# Patient Record
Sex: Male | Born: 1960 | Race: Black or African American | Hispanic: No | Marital: Single | Smoking: Never smoker
Health system: Southern US, Community
[De-identification: ages and names within clinical notes are randomized; demographics above are authoritative.]

## PROBLEM LIST (undated history)

## (undated) DIAGNOSIS — M1990 Unspecified osteoarthritis, unspecified site: Secondary | ICD-10-CM

## (undated) DIAGNOSIS — K852 Alcohol induced acute pancreatitis without necrosis or infection: Secondary | ICD-10-CM

## (undated) DIAGNOSIS — R1013 Epigastric pain: Secondary | ICD-10-CM

## (undated) DIAGNOSIS — K859 Acute pancreatitis without necrosis or infection, unspecified: Secondary | ICD-10-CM

## (undated) DIAGNOSIS — F1910 Other psychoactive substance abuse, uncomplicated: Secondary | ICD-10-CM

## (undated) DIAGNOSIS — T7840XA Allergy, unspecified, initial encounter: Secondary | ICD-10-CM

## (undated) DIAGNOSIS — I639 Cerebral infarction, unspecified: Secondary | ICD-10-CM

## (undated) DIAGNOSIS — J45909 Unspecified asthma, uncomplicated: Secondary | ICD-10-CM

## (undated) DIAGNOSIS — I10 Essential (primary) hypertension: Secondary | ICD-10-CM

## (undated) DIAGNOSIS — N179 Acute kidney failure, unspecified: Secondary | ICD-10-CM

## (undated) HISTORY — DX: Cerebral infarction, unspecified: I63.9

## (undated) HISTORY — DX: Acute kidney failure, unspecified: N17.9

## (undated) HISTORY — DX: Unspecified osteoarthritis, unspecified site: M19.90

## (undated) HISTORY — DX: Allergy, unspecified, initial encounter: T78.40XA

## (undated) HISTORY — PX: TONSILLECTOMY: SUR1361

## (undated) HISTORY — DX: Alcohol induced acute pancreatitis without necrosis or infection: K85.20

## (undated) HISTORY — DX: Other psychoactive substance abuse, uncomplicated: F19.10

## (undated) HISTORY — DX: Essential (primary) hypertension: I10

## (undated) HISTORY — DX: Epigastric pain: R10.13

---

## 2012-08-19 ENCOUNTER — Emergency Department: Payer: Self-pay | Admitting: Emergency Medicine

## 2012-08-19 LAB — COMPREHENSIVE METABOLIC PANEL
Alkaline Phosphatase: 72 U/L (ref 50–136)
Anion Gap: 10 (ref 7–16)
BUN: 11 mg/dL (ref 7–18)
Bilirubin,Total: 0.8 mg/dL (ref 0.2–1.0)
Calcium, Total: 8.4 mg/dL — ABNORMAL LOW (ref 8.5–10.1)
Co2: 23 mmol/L (ref 21–32)
Creatinine: 1.08 mg/dL (ref 0.60–1.30)
EGFR (Non-African Amer.): >60
Glucose: 92 mg/dL (ref 65–99)
SGOT(AST): 34 U/L (ref 15–37)
SGPT (ALT): 34 U/L (ref 12–78)
Total Protein: 7.5 g/dL (ref 6.4–8.2)

## 2012-08-19 LAB — CBC
Platelet: 185 10*3/uL (ref 150–440)
RDW: 12.8 % (ref 11.5–14.5)
WBC: 5.2 10*3/uL (ref 3.8–10.6)

## 2013-08-16 ENCOUNTER — Emergency Department: Payer: Self-pay | Admitting: Emergency Medicine

## 2013-08-16 LAB — URINALYSIS, COMPLETE
Bilirubin,UR: NEGATIVE
Glucose,UR: NEGATIVE mg/dL
Ketone: NEGATIVE
Leukocyte Esterase: NEGATIVE
Nitrite: NEGATIVE
Ph: 5
Protein: NEGATIVE
RBC,UR: <1 /HPF
Specific Gravity: 1.005
Squamous Epithelial: <1
WBC UR: <1 /HPF

## 2013-08-16 LAB — CBC
HCT: 43 % (ref 40.0–52.0)
HGB: 14.1 g/dL (ref 13.0–18.0)
MCH: 31 pg (ref 26.0–34.0)
MCHC: 32.7 g/dL (ref 32.0–36.0)
MCV: 95 fL (ref 80–100)
Platelet: 199 10*3/uL (ref 150–440)
RBC: 4.53 10*6/uL (ref 4.40–5.90)
RDW: 13.9 % (ref 11.5–14.5)
WBC: 5.9 10*3/uL (ref 3.8–10.6)

## 2013-08-16 LAB — COMPREHENSIVE METABOLIC PANEL
ALBUMIN: 3.8 g/dL (ref 3.4–5.0)
AST: 39 U/L — AB (ref 15–37)
Alkaline Phosphatase: 61 U/L
Anion Gap: 13 (ref 7–16)
BILIRUBIN TOTAL: 0.6 mg/dL (ref 0.2–1.0)
BUN: 17 mg/dL (ref 7–18)
CALCIUM: 8.7 mg/dL (ref 8.5–10.1)
Chloride: 104 mmol/L (ref 98–107)
Co2: 19 mmol/L — ABNORMAL LOW (ref 21–32)
Creatinine: 1.18 mg/dL (ref 0.60–1.30)
GLUCOSE: 83 mg/dL (ref 65–99)
OSMOLALITY: 273 (ref 275–301)
POTASSIUM: 3.2 mmol/L — AB (ref 3.5–5.1)
SGPT (ALT): 44 U/L (ref 12–78)
SODIUM: 136 mmol/L (ref 136–145)
Total Protein: 8 g/dL (ref 6.4–8.2)

## 2014-09-23 ENCOUNTER — Encounter: Payer: Self-pay | Admitting: Emergency Medicine

## 2014-09-23 ENCOUNTER — Inpatient Hospital Stay
Admission: EM | Admit: 2014-09-23 | Discharge: 2014-09-24 | DRG: 439 | Disposition: A | Discharge status: Home / Self Care | Payer: Self-pay | Attending: Internal Medicine | Admitting: Internal Medicine

## 2014-09-23 ENCOUNTER — Emergency Department: Payer: Self-pay

## 2014-09-23 DIAGNOSIS — Z8249 Family history of ischemic heart disease and other diseases of the circulatory system: Secondary | ICD-10-CM

## 2014-09-23 DIAGNOSIS — Z808 Family history of malignant neoplasm of other organs or systems: Secondary | ICD-10-CM

## 2014-09-23 DIAGNOSIS — E785 Hyperlipidemia, unspecified: Secondary | ICD-10-CM | POA: Diagnosis present

## 2014-09-23 DIAGNOSIS — K852 Alcohol induced acute pancreatitis without necrosis or infection: Secondary | ICD-10-CM | POA: Diagnosis present

## 2014-09-23 DIAGNOSIS — Z9889 Other specified postprocedural states: Secondary | ICD-10-CM

## 2014-09-23 DIAGNOSIS — R1013 Epigastric pain: Secondary | ICD-10-CM

## 2014-09-23 DIAGNOSIS — R509 Fever, unspecified: Secondary | ICD-10-CM

## 2014-09-23 DIAGNOSIS — I10 Essential (primary) hypertension: Secondary | ICD-10-CM | POA: Diagnosis present

## 2014-09-23 DIAGNOSIS — N179 Acute kidney failure, unspecified: Secondary | ICD-10-CM | POA: Diagnosis present

## 2014-09-23 DIAGNOSIS — F1410 Cocaine abuse, uncomplicated: Secondary | ICD-10-CM | POA: Diagnosis present

## 2014-09-23 HISTORY — DX: Acute pancreatitis without necrosis or infection, unspecified: K85.90

## 2014-09-23 HISTORY — DX: Alcohol induced acute pancreatitis without necrosis or infection: K85.20

## 2014-09-23 HISTORY — DX: Essential (primary) hypertension: I10

## 2014-09-23 LAB — URINE DRUG SCREEN, QUALITATIVE (ARMC ONLY)
Amphetamines, Ur Screen: NOT DETECTED
BENZODIAZEPINE, UR SCRN: NOT DETECTED
Barbiturates, Ur Screen: NOT DETECTED
CANNABINOID 50 NG, UR ~~LOC~~: NOT DETECTED
COCAINE METABOLITE, UR ~~LOC~~: POSITIVE — AB
MDMA (Ecstasy)Ur Screen: NOT DETECTED
METHADONE SCREEN, URINE: NOT DETECTED
Opiate, Ur Screen: POSITIVE — AB
Phencyclidine (PCP) Ur S: NOT DETECTED
Tricyclic, Ur Screen: NOT DETECTED

## 2014-09-23 LAB — HEPATIC FUNCTION PANEL
ALT: 27 U/L (ref 17–63)
AST: 49 U/L — ABNORMAL HIGH (ref 15–41)
Albumin: 4 g/dL (ref 3.5–5.0)
Alkaline Phosphatase: 68 U/L (ref 38–126)
Bilirubin, Direct: 0.5 mg/dL (ref 0.1–0.5)
Indirect Bilirubin: 1.8 mg/dL — ABNORMAL HIGH (ref 0.3–0.9)
TOTAL PROTEIN: 8.3 g/dL — AB (ref 6.5–8.1)
Total Bilirubin: 2.3 mg/dL — ABNORMAL HIGH (ref 0.3–1.2)

## 2014-09-23 LAB — BASIC METABOLIC PANEL
ANION GAP: 13 (ref 5–15)
BUN: 25 mg/dL — ABNORMAL HIGH (ref 6–20)
CO2: 24 mmol/L (ref 22–32)
Calcium: 8.8 mg/dL — ABNORMAL LOW (ref 8.9–10.3)
Chloride: 97 mmol/L — ABNORMAL LOW (ref 101–111)
Creatinine, Ser: 1.56 mg/dL — ABNORMAL HIGH (ref 0.61–1.24)
GFR, EST AFRICAN AMERICAN: 57 mL/min — AB (ref >60)
GFR, EST NON AFRICAN AMERICAN: 49 mL/min — AB (ref >60)
Glucose, Bld: 87 mg/dL (ref 65–99)
POTASSIUM: 4.4 mmol/L (ref 3.5–5.1)
SODIUM: 134 mmol/L — AB (ref 135–145)

## 2014-09-23 LAB — CBC WITH DIFFERENTIAL/PLATELET
BASOS PCT: 1 %
Basophils Absolute: 0.1 10*3/uL (ref 0–0.1)
Eosinophils Absolute: 0 10*3/uL (ref 0–0.7)
Eosinophils Relative: 0 %
HCT: 41.3 % (ref 40.0–52.0)
Hemoglobin: 13.9 g/dL (ref 13.0–18.0)
LYMPHS ABS: 0.2 10*3/uL — AB (ref 1.0–3.6)
Lymphocytes Relative: 2 %
MCH: 31.5 pg (ref 26.0–34.0)
MCHC: 33.6 g/dL (ref 32.0–36.0)
MCV: 93.7 fL (ref 80.0–100.0)
MONO ABS: 0.6 10*3/uL (ref 0.2–1.0)
Monocytes Relative: 4 %
NEUTROS ABS: 14.5 10*3/uL — AB (ref 1.4–6.5)
Neutrophils Relative %: 93 %
Platelets: 174 10*3/uL (ref 150–440)
RBC: 4.4 MIL/uL (ref 4.40–5.90)
RDW: 13.3 % (ref 11.5–14.5)
WBC: 15.4 10*3/uL — AB (ref 3.8–10.6)

## 2014-09-23 LAB — LIPID PANEL
CHOLESTEROL: 189 mg/dL (ref 0–200)
HDL: 73 mg/dL (ref >40)
LDL CALC: 105 mg/dL — AB (ref 0–99)
TRIGLYCERIDES: 54 mg/dL (ref <150)
Total CHOL/HDL Ratio: 2.6 RATIO
VLDL: 11 mg/dL (ref 0–40)

## 2014-09-23 LAB — TROPONIN I: Troponin I: <0.03 ng/mL (ref <0.031)

## 2014-09-23 LAB — LIPASE, BLOOD: Lipase: 1349 U/L — ABNORMAL HIGH (ref 22–51)

## 2014-09-23 LAB — ETHANOL: Alcohol, Ethyl (B): <5 mg/dL (ref <5)

## 2014-09-23 MED ORDER — ACETAMINOPHEN 325 MG PO TABS: 650.0000 mg | ORAL_TABLET | Freq: Four times a day (QID) | ORAL | Status: DC | PRN

## 2014-09-23 MED ORDER — ACETAMINOPHEN 500 MG PO TABS: 1000.0000 mg | ORAL_TABLET | Freq: Once | ORAL | Status: DC

## 2014-09-23 MED ORDER — ACETAMINOPHEN 650 MG RE SUPP: 650.0000 mg | Freq: Four times a day (QID) | RECTAL | Status: DC | PRN

## 2014-09-23 MED ORDER — FOLIC ACID 1 MG PO TABS
1.0000 mg | ORAL_TABLET | Freq: Every day | ORAL | Status: DC
  Administered 2014-09-23 – 2014-09-24 (×2): 1 mg via ORAL
  Filled 2014-09-23: qty 1

## 2014-09-23 MED ORDER — LORAZEPAM 1 MG PO TABS: 1.0000 mg | ORAL_TABLET | Freq: Four times a day (QID) | ORAL | Status: DC | PRN

## 2014-09-23 MED ORDER — SODIUM CHLORIDE 0.9 % IV SOLN
INTRAVENOUS | Status: DC
  Administered 2014-09-23: 11:00:00 via INTRAVENOUS

## 2014-09-23 MED ORDER — HYDROCODONE-ACETAMINOPHEN 5-325 MG PO TABS
1.0000 | ORAL_TABLET | ORAL | Status: DC | PRN
  Administered 2014-09-23 – 2014-09-24 (×3): 2 via ORAL
  Administered 2014-09-24: 1 via ORAL
  Filled 2014-09-23: qty 1
  Filled 2014-09-23 (×2): qty 2

## 2014-09-23 MED ORDER — MORPHINE SULFATE 2 MG/ML IJ SOLN
INTRAMUSCULAR | Status: AC
  Administered 2014-09-23: 1 mg via INTRAVENOUS
  Filled 2014-09-23: qty 1

## 2014-09-23 MED ORDER — VITAMIN B-1 100 MG PO TABS
ORAL_TABLET | ORAL | Status: AC
  Administered 2014-09-23: 100 mg via ORAL
  Filled 2014-09-23: qty 1

## 2014-09-23 MED ORDER — ADULT MULTIVITAMIN W/MINERALS CH
1.0000 | ORAL_TABLET | Freq: Every day | ORAL | Status: DC
  Administered 2014-09-23 – 2014-09-24 (×2): 1 via ORAL
  Filled 2014-09-23 (×2): qty 1

## 2014-09-23 MED ORDER — ONDANSETRON HCL 4 MG/2ML IJ SOLN
4.0000 mg | Freq: Four times a day (QID) | INTRAMUSCULAR | Status: DC | PRN
  Administered 2014-09-23: 4 mg via INTRAVENOUS
  Filled 2014-09-23: qty 2

## 2014-09-23 MED ORDER — LORAZEPAM 2 MG/ML IJ SOLN: 1.0000 mg | Freq: Four times a day (QID) | INTRAMUSCULAR | Status: DC | PRN

## 2014-09-23 MED ORDER — ALUM & MAG HYDROXIDE-SIMETH 200-200-20 MG/5ML PO SUSP: 30.0000 mL | Freq: Four times a day (QID) | ORAL | Status: DC | PRN

## 2014-09-23 MED ORDER — LORAZEPAM 2 MG/ML IJ SOLN: 0.0000 mg | Freq: Four times a day (QID) | INTRAMUSCULAR | Status: DC

## 2014-09-23 MED ORDER — VITAMIN B-1 100 MG PO TABS
100.0000 mg | ORAL_TABLET | Freq: Every day | ORAL | Status: DC
  Administered 2014-09-23: 100 mg via ORAL
  Filled 2014-09-23: qty 1

## 2014-09-23 MED ORDER — SODIUM CHLORIDE 0.9 % IJ SOLN: 3.0000 mL | Freq: Two times a day (BID) | INTRAMUSCULAR | Status: DC

## 2014-09-23 MED ORDER — MORPHINE SULFATE 2 MG/ML IJ SOLN
1.0000 mg | INTRAMUSCULAR | Status: DC | PRN
  Administered 2014-09-23 (×2): 1 mg via INTRAVENOUS
  Filled 2014-09-23: qty 1

## 2014-09-23 MED ORDER — HYDRALAZINE HCL 20 MG/ML IJ SOLN
10.0000 mg | Freq: Four times a day (QID) | INTRAMUSCULAR | Status: DC | PRN
  Administered 2014-09-23: 10 mg via INTRAVENOUS
  Filled 2014-09-23: qty 1

## 2014-09-23 MED ORDER — MORPHINE SULFATE 4 MG/ML IJ SOLN
4.0000 mg | Freq: Once | INTRAMUSCULAR | Status: AC
  Administered 2014-09-23: 4 mg via INTRAVENOUS

## 2014-09-23 MED ORDER — FOLIC ACID 1 MG PO TABS
ORAL_TABLET | ORAL | Status: AC
  Administered 2014-09-23: 1 mg via ORAL
  Filled 2014-09-23: qty 1

## 2014-09-23 MED ORDER — THIAMINE HCL 100 MG/ML IJ SOLN
100.0000 mg | Freq: Every day | INTRAMUSCULAR | Status: DC
  Administered 2014-09-24: 100 mg via INTRAVENOUS
  Filled 2014-09-23: qty 2

## 2014-09-23 MED ORDER — ENOXAPARIN SODIUM 40 MG/0.4ML ~~LOC~~ SOLN
40.0000 mg | SUBCUTANEOUS | Status: DC
  Administered 2014-09-23: 40 mg via SUBCUTANEOUS
  Filled 2014-09-23: qty 0.4

## 2014-09-23 MED ORDER — CLONIDINE HCL 0.1 MG PO TABS
ORAL_TABLET | ORAL | Status: AC
  Administered 2014-09-23: 0.1 mg via ORAL
  Filled 2014-09-23: qty 1

## 2014-09-23 MED ORDER — MORPHINE SULFATE 4 MG/ML IJ SOLN
INTRAMUSCULAR | Status: AC
  Administered 2014-09-23: 4 mg via INTRAVENOUS
  Filled 2014-09-23: qty 1

## 2014-09-23 MED ORDER — CLONIDINE HCL 0.1 MG PO TABS
0.1000 mg | ORAL_TABLET | Freq: Two times a day (BID) | ORAL | Status: DC
  Administered 2014-09-23 – 2014-09-24 (×3): 0.1 mg via ORAL
  Filled 2014-09-23 (×2): qty 1

## 2014-09-23 MED ORDER — ONDANSETRON HCL 4 MG/2ML IJ SOLN
4.0000 mg | Freq: Once | INTRAMUSCULAR | Status: AC
  Administered 2014-09-23: 4 mg via INTRAVENOUS

## 2014-09-23 MED ORDER — THIAMINE HCL 100 MG/ML IJ SOLN
Freq: Once | INTRAVENOUS | Status: AC
  Administered 2014-09-23: 09:00:00 via INTRAVENOUS
  Filled 2014-09-23: qty 1000

## 2014-09-23 MED ORDER — HYDROCODONE-ACETAMINOPHEN 5-325 MG PO TABS
ORAL_TABLET | ORAL | Status: AC
  Administered 2014-09-23: 2 via ORAL
  Filled 2014-09-23: qty 2

## 2014-09-23 MED ORDER — HYDRALAZINE HCL 20 MG/ML IJ SOLN: 10.0000 mg | Freq: Four times a day (QID) | INTRAMUSCULAR | Status: DC | PRN

## 2014-09-23 MED ORDER — ONDANSETRON HCL 4 MG/2ML IJ SOLN
INTRAMUSCULAR | Status: AC
  Administered 2014-09-23: 4 mg via INTRAVENOUS
  Filled 2014-09-23: qty 2

## 2014-09-23 MED ORDER — ONDANSETRON HCL 4 MG PO TABS: 4.0000 mg | ORAL_TABLET | Freq: Four times a day (QID) | ORAL | Status: DC | PRN

## 2014-09-23 NOTE — ED Provider Notes (Signed)
[facility] Emergency Department Provider Note  ____________________________________________  Time seen: Approximately 4:38 AM  I have reviewed the triage vital signs and the nursing notes.   HISTORY  Chief Complaint Abdominal Pain    HPI Paul Howard is a 54 y.o. male who presents to the ED via EMS from home for complaints of epigastric pain. Complains of 10/10 nonradiating epigastric pain onset yesterday afternoon. Patient has a history of alcohol-induced pancreatitis. Patient states he had 3 episodes of nausea/vomiting. Admits to daily drinking of beers. Denies history of DTs. Denies chest pain, shortness of breath, diarrhea, numbness, tingling. Patient was not aware he is running a fever.   Past Medical History  Diagnosis Date  . Hypertension   . Pancreatitis     There are no active problems to display for this patient.   Past Surgical History  Procedure Laterality Date  . Tonsillectomy      No current outpatient prescriptions on file.  Allergies Review of patient's allergies indicates no known allergies.  No family history on file.  Social History History  Substance Use Topics  . Smoking status: Never Smoker   . Smokeless tobacco: Not on file  . Alcohol Use: 1.8 oz/week    3 Cans of beer per week    Review of Systems Constitutional: No fever/chills Eyes: No visual changes. ENT: No sore throat. Cardiovascular: Denies chest pain. Respiratory: Denies shortness of breath. Gastrointestinal: Positive for abdominal pain.  Positive for nausea and vomiting.  No diarrhea.  No constipation. Genitourinary: Negative for dysuria. Musculoskeletal: Negative for back pain. Skin: Negative for rash. Neurological: Negative for headaches, focal weakness or numbness.  10-point ROS otherwise negative.  ____________________________________________   PHYSICAL EXAM:  VITAL SIGNS: ED Triage Vitals  Enc Vitals Group     BP 09/23/14 0345  188/122 mmHg     Pulse Rate 09/23/14 0345 113     Resp 09/23/14 0345 28     Temp 09/23/14 0345 100.8 F (38.2 C)     Temp Source 09/23/14 0345 Oral     SpO2 --      Weight 09/23/14 0345 165 lb (74.844 kg)     Height 09/23/14 0345 5\' 6"  (1.676 m)     Head Cir --      Peak Flow --      Pain Score 09/23/14 0346 10     Pain Loc --      Pain Edu? --      Excl. in [place]? --     Constitutional: Alert and oriented. Well appearing and in moderate acute distress. Eyes: Conjunctivae are bloodshot bilaterally. PERRL. EOMI. Head: Atraumatic. Nose: No congestion/rhinnorhea. Mouth/Throat: Mucous membranes are moist.  Oropharynx non-erythematous. Neck: No stridor.   Cardiovascular: Tachycardic, regular rhythm. Grossly normal heart sounds.  Good peripheral circulation. Respiratory: Normal respiratory effort.  No retractions. Lungs CTAB. Gastrointestinal: Soft, tender to palpation epigastrium with voluntary guarding. No distention. No abdominal bruits. No CVA tenderness. Musculoskeletal: No lower extremity tenderness nor edema.  No joint effusions. Neurologic:  Normal speech and language. No gross focal neurologic deficits are appreciated. Speech is normal.  Skin:  Skin is warm, dry and intact. No rash noted. Psychiatric: Mood and affect are normal. Speech and behavior are normal.  ____________________________________________   LABS (all labs ordered are listed, but only abnormal results are displayed)  Labs Reviewed  CBC WITH DIFFERENTIAL/PLATELET - Abnormal; Notable for the following:    WBC 15.4 (*)    Neutro Abs 14.5 (*)  Lymphs Abs 0.2 (*)    All other components within normal limits  BASIC METABOLIC PANEL - Abnormal; Notable for the following:    Sodium 134 (*)    Chloride 97 (*)    BUN 25 (*)    Creatinine, Ser 1.56 (*)    Calcium 8.8 (*)    GFR calc non Af Amer 49 (*)    GFR calc Af Amer 57 (*)    All other components within normal limits  LIPASE, BLOOD - Abnormal; Notable for  the following:    Lipase 1349 (*)    All other components within normal limits  HEPATIC FUNCTION PANEL - Abnormal; Notable for the following:    Total Protein 8.3 (*)    AST 49 (*)    Total Bilirubin 2.3 (*)    Indirect Bilirubin 1.8 (*)    All other components within normal limits  ETHANOL  TROPONIN I   ____________________________________________  EKG  ED ECG REPORT I, SUNG,JADE J, the attending physician, personally viewed and interpreted this ECG.   Date: 09/23/2014  EKG Time: 0442  Rate: 111  Rhythm: sinus tachycardia  Axis: Normal  Intervals:none  ST&T Change: Inverted T waves inferior laterally  ____________________________________________  RADIOLOGY  Ultrasound abdomen limited RUQ interpreted per Dr. Pascal Lux: Mild gallbladder wall thickening and edema without cholelithiasis. Favor reactive changes (potentially from patient's pancreatitis) over cholecystitis from occult stone.  ____________________________________________   PROCEDURES  Procedure(s) performed: None  Critical Care performed: Yes, see critical care note(s)   CRITICAL CARE Performed by: Paulette Blanch   Total critical care time: 30 minutes  Critical care time was exclusive of separately billable procedures and treating other patients.  Critical care was necessary to treat or prevent imminent or life-threatening deterioration.  Critical care was time spent personally by me on the following activities: development of treatment plan with patient and/or surrogate as well as nursing, discussions with consultants, evaluation of patient's response to treatment, examination of patient, obtaining history from patient or surrogate, ordering and performing treatments and interventions, ordering and review of laboratory studies, ordering and review of radiographic studies, pulse oximetry and re-evaluation of patient's condition.   ____________________________________________   INITIAL IMPRESSION /  ASSESSMENT AND PLAN / ED COURSE  Pertinent labs & imaging results that were available during my care of the patient were reviewed by me and considered in my medical decision making (see chart for details).  54 year old male who presents with epigastric pain, fever and vomiting. Prior history of alcohol-induced pancreatitis. Will check lab work including liver function and lipase, infuse IV fluids, IV analgesia, obtain ultrasound to evaluate biliary tree.  ----------------------------------------- 6:15 AM on 09/23/2014 -----------------------------------------  Laboratory results notable for renal insufficiency, elevated bilirubin and lipase. I have discussed case with Dr. Marcille Blanco who will evaluate patient in the ED for hospital admission.  ----------------------------------------- 7:37 AM on 09/23/2014 -----------------------------------------  Updated Dr. Posey Pronto of ultrasound findings. Patient is resting comfortably in no acute distress. ____________________________________________   FINAL CLINICAL IMPRESSION(S) / ED DIAGNOSES  Final diagnoses:  Epigastric pain  Fever, unspecified fever cause  Alcohol-induced acute pancreatitis      Paulette Blanch, MD 09/23/14 [phone]

## 2014-09-23 NOTE — Progress Notes (Signed)
°   09/23/14 1100  Clinical Encounter Type  Visited With Patient (Prayer provided as requested.)  Visit Type Initial  Referral From Nurse  Spiritual Encounters  Spiritual Needs Prayer (Pastoral Care provided a well as prayer.)

## 2014-09-23 NOTE — H&P (Addendum)
[name] at [place] NAME: Paul Howard    MR#:  [id number]  DATE OF BIRTH:  April 27, 1960  DATE OF ADMISSION:  09/23/2014  PRIMARY CARE PHYSICIAN: No primary care provider on file.   REQUESTING/REFERRING PHYSICIAN: DR Luvenia Starch SUNG  CHIEF COMPLAINT:  Abdominal pain since yesterday 2:00 pm  HISTORY OF PRESENT ILLNESS:  Paul Howard  is a 54 y.o. male with a known history of hypertension not on any medications, daily alcohol use, intermittent cocaine use comes to the emergency room with complaints of abdominal pain in the epigastric area along with nausea since yesterday at 2:00. Patient reports drinking daily 3-4 cans of beer his last drink was yesterday before 2:00. Denies any fever or any vomiting.  In the emergency room patient was found to have elevated lipase around 1300 and given his history of alcohol-induced pancreatitis in the remote past he is being admitted for further evaluation management for alcohol-induced pancreatitis.  Ultrasound of the abdomen shows no evidence of gallstones.  PAST MEDICAL HISTORY:   Past Medical History  Diagnosis Date  . Hypertension   . Pancreatitis     PAST SURGICAL HISTOIRY:   Past Surgical History  Procedure Laterality Date  . Tonsillectomy    . No past surgeries      SOCIAL HISTORY:   History  Substance Use Topics  . Smoking status: Never Smoker   . Smokeless tobacco: Not on file  . Alcohol Use: 1.8 oz/week    3 Cans of beer per week    FAMILY HISTORY:   Family History  Problem Relation Age of Onset  . Hypertension Mother   . Throat cancer Father     DRUG ALLERGIES:  No Known Allergies  REVIEW OF SYSTEMS:  Review of Systems  Constitutional: Negative for fever, chills and diaphoresis.  HENT: Negative for congestion, ear pain, hearing loss, nosebleeds and sore throat.   Eyes: Negative for blurred vision, double vision, photophobia and pain.  Respiratory: Negative for  hemoptysis, sputum production, wheezing and stridor.   Cardiovascular: Negative for orthopnea, claudication and leg swelling.  Gastrointestinal: Positive for nausea and abdominal pain. Negative for heartburn.  Genitourinary: Negative for dysuria and frequency.  Musculoskeletal: Negative for back pain, joint pain and neck pain.  Skin: Negative for rash.  Neurological: Positive for weakness. Negative for tingling, sensory change, speech change, focal weakness, seizures and headaches.  Endo/Heme/Allergies: Does not bruise/bleed easily.  Psychiatric/Behavioral: Positive for substance abuse. Negative for memory loss. The patient is nervous/anxious.   All other systems reviewed and are negative.    MEDICATIONS AT HOME:   Prior to Admission medications   Not on File      VITAL SIGNS:  Blood pressure 169/114, pulse 104, temperature 100.8 F (38.2 C), temperature source Oral, resp. rate 28, height 5\' 6"  (1.676 m), weight 74.844 kg (165 lb), SpO2 89 %.  PHYSICAL EXAMINATION:  GENERAL:  54 y.o.-year-old patient lying in the bed with no acute distress.  EYES: Pupils equal, round, reactive to light and accommodation. No scleral icterus. Extraocular muscles intact.  HEENT: Head atraumatic, normocephalic. Oropharynx and nasopharynx clear.  NECK:  Supple, no jugular venous distention. No thyroid enlargement, no tenderness.  LUNGS: Normal breath sounds bilaterally, no wheezing, rales,rhonchi or crepitation. No use of accessory muscles of respiration.  CARDIOVASCULAR: S1, S2 normal. No murmurs, rubs, or gallops.  ABDOMEN: Soft,tenderness over the epigastric area, nondistended. Bowel sounds present. No organomegaly or mass.  EXTREMITIES: No pedal edema,  cyanosis, or clubbing.  NEUROLOGIC: Cranial nerves II through XII are intact. Muscle strength 5/5 in all extremities. Sensation intact. Gait not checked.  PSYCHIATRIC: The patient is alert and oriented x 3.  SKIN: No obvious rash, lesion, or ulcer.    LABORATORY PANEL:   CBC  Recent Labs Lab 09/23/14 0400  WBC 15.4*  HGB 13.9  HCT 41.3  PLT 174   ------------------------------------------------------------------------------------------------------------------  Chemistries   Recent Labs Lab 09/23/14 0400  NA 134*  K 4.4  CL 97*  CO2 24  GLUCOSE 87  BUN 25*  CREATININE 1.56*  CALCIUM 8.8*  AST 49*  ALT 27  ALKPHOS 68  BILITOT 2.3*   ------------------------------------------------------------------------------------------------------------------  Cardiac Enzymes  Recent Labs Lab 09/23/14 0400  TROPONINI <0.03   ------------------------------------------------------------------------------------------------------------------  RADIOLOGY:  US Abdomen Limited Ruq  09/23/2014   CLINICAL DATA:  Epigastric pain since yesterday.  Elevated lipase.  EXAM: US ABDOMEN LIMITED - RIGHT UPPER QUADRANT  COMPARISON:  None.  FINDINGS: Gallbladder:  Abnormal gallbladder with fluid along the liver surface where there is also thickening to 4 mm. No cholelithiasis noted. Per sonographer exam, no focal tenderness.  Common bile duct:  Diameter: 4 mm  Liver:  No focal lesion identified. Within normal limits in parenchymal echogenicity. Antegrade flow in the imaged portal venous system.  IMPRESSION: Mild gallbladder wall thickening and edema without cholelithiasis. Favor reactive changes (potentially from patient's pancreatitis) over cholecystitis from occult stone.   Electronically Signed   By: Monte Fantasia M.D.   On: 09/23/2014 06:24    EKG:  s tachycardia IMPRESSION AND PLAN:   54 year old PaulHoward with known history of hypertension not on any medication hyperlipidemia not on any medication comes to the emergency room with significant epigastric pain and nausea. He was found to have elevated lipase 2 1300. Patient is being admitted with  #1 acute alcohol induced pancreatitis Admit to med surge with off unit telemetry Nothing  by mouth IV fluids When necessary IV morphine, by mouth Percocet as needed Follow-up lipase GI/surgical consultation if needed  #2 nausea When necessary Zofran  #3 alcohol abuse Serum alcohol level within normal limits CIWA protocol. Watch for signs of withdrawal.  #4 substance abuse-cocaine Check urine drug screen  #5 Acute renal failure Creat 1.5 IVF and follow creat Avoid nephrotoxins  #6 DVT prophylaxis Lovenox  Above was discussed with patient no family members present.  All the records are reviewed and case discussed with ED provider. Management plans discussed with the patient and  in agreement.  CODE STATUS: Full  TOTAL TIME TAKING CARE OF THIS PATIENT: 50 minutes.    Elvis Boot M.D on 09/23/2014 at 7:50 AM  Between 7am to 6pm - Pager - [phone]  After 6pm go to [email or web address] - password [email or web address] [facility]  [facility]  Office  [phone]  CC: Primary care physician; No primary care provider on file.

## 2014-09-23 NOTE — ED Notes (Signed)
Patient reports left upper quad abd pain that started yesterday.  Patient reports feels similar to previous episode of pancreatitis.

## 2014-09-23 NOTE — ED Notes (Signed)
Patient resting quietly with eyes closed. No acute distress noted.

## 2014-09-23 NOTE — ED Notes (Signed)
Pt sleeping. 

## 2014-09-23 NOTE — ED Notes (Signed)
Patient transported to Ultrasound 

## 2014-09-23 NOTE — ED Notes (Signed)
Pt in bed alert and oriented x3.

## 2014-09-23 NOTE — ED Notes (Signed)
Contacted Sona Patel about patient's blood pressure. She requested patient be placed on 2A. Stated would address his blood pressure issues when he arrived to 2A.

## 2014-09-23 NOTE — Progress Notes (Signed)
Pt. admitted to unit. Oriented to room, call bell, Ascom phones and staff. Bed in low position. Fall safety plan reviewed. Full assessment to Epic. Will continue to monitor.   

## 2014-09-24 LAB — CREATININE, SERUM
Creatinine, Ser: 1.67 mg/dL — ABNORMAL HIGH (ref 0.61–1.24)
GFR calc non Af Amer: 46 mL/min — ABNORMAL LOW (ref >60)
GFR, EST AFRICAN AMERICAN: 53 mL/min — AB (ref >60)

## 2014-09-24 LAB — LIPASE, BLOOD: LIPASE: 282 U/L — AB (ref 22–51)

## 2014-09-24 MED ORDER — CLONIDINE HCL 0.1 MG PO TABS: 0.1000 mg | ORAL_TABLET | Freq: Two times a day (BID) | ORAL | Status: DC

## 2014-09-24 MED ORDER — ADULT MULTIVITAMIN W/MINERALS CH: 1.0000 | ORAL_TABLET | Freq: Every day | ORAL | Status: DC

## 2014-09-24 MED ORDER — FOLIC ACID 1 MG PO TABS: 1.0000 mg | ORAL_TABLET | Freq: Every day | ORAL | Status: DC

## 2014-09-24 MED ORDER — HYDROCODONE-ACETAMINOPHEN 5-325 MG PO TABS: 1.0000 | ORAL_TABLET | ORAL | Status: DC | PRN

## 2014-09-24 MED ORDER — PNEUMOCOCCAL VAC POLYVALENT 25 MCG/0.5ML IJ INJ: 0.5000 mL | INJECTION | INTRAMUSCULAR | Status: DC

## 2014-09-24 NOTE — Clinical Social Work Note (Signed)
CSW spoke to pt.  He stated that he lived in a boarding house, and denied abuse at home.  Confirmed that he felt safe at his current residence.  He also stated that he had been drinking 2 40oz drinks a day for 20 years.  He stated that he is interested in getting assistance with stopping this.  CSW gave him information for [facility] services and also a list of [facility] meetings.  CSW instructed pt that he needed to contact [facility] and set up an appointment with them to begin the screening services.  CSW signing off unless other needs arise

## 2014-09-24 NOTE — Progress Notes (Signed)
Pt discharged to home, gave him specific instructions re hwhen he can start solid food, what to avoid, and to abstain from ETOH and cocaine.  Also told him to fill clonidine scrip across the street.  Pt given written instructions re ETOH withdrawal and pancreatitis.  Pt verbalized understanding.  Left hospital in car with his friend

## 2014-09-24 NOTE — Care Management (Addendum)
Script for Clonidine faxed to [facility] And confirmed agency will be able to fill it.  Informed patient and gave directions to agency.  Informed him could not assist with vicodin or vitamins.  UPdated primary nurse

## 2014-09-24 NOTE — Care Management (Signed)
Patient presents from home.  He works full time at [employer] but does not have insurance.  Denies having any dependents.  Denies issues with transportation.  He says he was suppose to be on blood pressure medications but stopped it because the pills made him sick.  He has accepted resources for alcoholism.  Provided patient with application to [facility].  Discussed the process for completing applications and calling from his room to make initial appointment at [facility].  Anticipate possible discharge today.  Will watch closely for discharge medications- especially those related to blood pressure

## 2014-09-24 NOTE — Discharge Instructions (Signed)
Full liquid diet for 2-3 days and then sof diet Avoid fried/greasy and  fatty food! Stop drinking alcohol and stop using cocaine

## 2014-09-24 NOTE — Discharge Summary (Signed)
[place] at [place] NAME: Augustus Allman    MR#:  [id number]  DATE OF BIRTH:  1960-06-22  DATE OF ADMISSION:  09/23/2014 ADMITTING PHYSICIAN: Fritzi Mandes, MD  DATE OF DISCHARGE: 09/24/14  PRIMARY CARE PHYSICIAN: No primary care provider on file.    ADMISSION DIAGNOSIS:  Epigastric pain [R10.13] Alcohol-induced acute pancreatitis [K85.2] Fever, unspecified fever cause [R50.9]  DISCHARGE DIAGNOSIS:  Acute alcohol induced pancreatitis UDS positive for Cocaine HTN-New  SECONDARY DIAGNOSIS:   Past Medical History  Diagnosis Date  . Hypertension   . Pancreatitis     HOSPITAL COURSE:  54 year old Mr.Radman with known history of hypertension not on any medication hyperlipidemia not on any medication comes to the emergency room with significant epigastric pain and nausea. He was found to have elevated lipase of 1300. Patient is being admitted with  #1 acute alcohol induced pancreatitis Pain improved Tolerating po CLD Received IV fluids po Percocet as needed Lipase 1300--->282  #2 HTN-new  Now better with clonidine bid  #3 alcohol abuse Serum alcohol level within normal limits CIWA protocol scoring very low  no signs of withdrawal so far Advised abstinence. Pt agreeable. outpt resources for f/u provided by CSW  #4 substance abuse-cocaine UDS positive for cocaine. Advised abstinence  #5 Acute renal failure Creat 1.5 Avoid nephrotoxins  #6 DVT prophylaxis Lovenox  Overall much improved. Ok to go home today  DISCHARGE CONDITIONS:   fair  CONSULTS OBTAINED:     DRUG ALLERGIES:  No Known Allergies  DISCHARGE MEDICATIONS:   Current Discharge Medication List    START taking these medications   Details  cloNIDine (CATAPRES) 0.1 MG tablet Take 1 tablet (0.1 mg total) by mouth 2 (two) times daily. Qty: 60 tablet, Refills: 11    folic acid (FOLVITE) 1 MG tablet Take 1 tablet (1 mg total) by mouth daily. Qty: 30  tablet, Refills: 0    HYDROcodone-acetaminophen (NORCO/VICODIN) 5-325 MG per tablet Take 1-2 tablets by mouth every 4 (four) hours as needed for moderate pain. Qty: 30 tablet, Refills: 0    Multiple Vitamin (MULTIVITAMIN WITH MINERALS) TABS tablet Take 1 tablet by mouth daily. Qty: 30 tablet, Refills: 0        If you experience worsening of your admission symptoms, develop shortness of breath, life threatening emergency, suicidal or homicidal thoughts you must seek medical attention immediately by calling 911 or calling your MD immediately  if symptoms less severe.  You Must read complete instructions/literature along with all the possible adverse reactions/side effects for all the Medicines you take and that have been prescribed to you. Take any new Medicines after you have completely understood and accept all the possible adverse reactions/side effects.   Please note  You were cared for by a hospitalist during your hospital stay. If you have any questions about your discharge medications or the care you received while you were in the hospital after you are discharged, you can call the unit and asked to speak with the hospitalist on call if the hospitalist that took care of you is not available. Once you are discharged, your primary care physician will handle any further medical issues. Please note that NO REFILLS for any discharge medications will be authorized once you are discharged, as it is imperative that you return to your primary care physician (or establish a relationship with a primary care physician if you do not have one) for your aftercare needs so that they can reassess your  need for medications and monitor your lab values. Today   SUBJECTIVE   Feels better than y'day. Tolerated CLD. Mild epigastric pain.   VITAL SIGNS:  Blood pressure 138/82, pulse 89, temperature 98.2 F (36.8 C), temperature source Oral, resp. rate 18, height 5\' 6"  (1.676 m), weight 77.656 kg (171 lb 3.2  oz), SpO2 97 %.  I/O:   Intake/Output Summary (Last 24 hours) at 09/24/14 1019 Last data filed at 09/24/14 0743  Gross per 24 hour  Intake 1195.12 ml  Output    550 ml  Net 645.12 ml    PHYSICAL EXAMINATION:  GENERAL:  54 y.o.-year-old patient lying in the bed with no acute distress.  EYES: Pupils equal, round, reactive to light and accommodation. No scleral icterus. Extraocular muscles intact.  HEENT: Head atraumatic, normocephalic. Oropharynx and nasopharynx clear.  NECK:  Supple, no jugular venous distention. No thyroid enlargement, no tenderness.  LUNGS: Normal breath sounds bilaterally, no wheezing, rales,rhonchi or crepitation. No use of accessory muscles of respiration.  CARDIOVASCULAR: S1, S2 normal. No murmurs, rubs, or gallops.  ABDOMEN: Soft, mild epigastric tender, non-distended. Bowel sounds present. No organomegaly or mass.  EXTREMITIES: No pedal edema, cyanosis, or clubbing. No tremors NEUROLOGIC: Cranial nerves II through XII are intact. Muscle strength 5/5 in all extremities. Sensation intact. Gait not checked.  PSYCHIATRIC: The patient is alert and oriented x 3.  SKIN: No obvious rash, lesion, or ulcer.   DATA REVIEW:   CBC   Recent Labs Lab 09/23/14 0400  WBC 15.4*  HGB 13.9  HCT 41.3  PLT 174    Chemistries   Recent Labs Lab 09/23/14 0400 09/24/14 0333  NA 134*  --   K 4.4  --   CL 97*  --   CO2 24  --   GLUCOSE 87  --   BUN 25*  --   CREATININE 1.56* 1.67*  CALCIUM 8.8*  --   AST 49*  --   ALT 27  --   ALKPHOS 68  --   BILITOT 2.3*  --     RADIOLOGY:  US Abdomen Limited Ruq  09/23/2014   CLINICAL DATA:  Epigastric pain since yesterday.  Elevated lipase.  EXAM: US ABDOMEN LIMITED - RIGHT UPPER QUADRANT  COMPARISON:  None.  FINDINGS: Gallbladder:  Abnormal gallbladder with fluid along the liver surface where there is also thickening to 4 mm. No cholelithiasis noted. Per sonographer exam, no focal tenderness.  Common bile duct:  Diameter: 4  mm  Liver:  No focal lesion identified. Within normal limits in parenchymal echogenicity. Antegrade flow in the imaged portal venous system.  IMPRESSION: Mild gallbladder wall thickening and edema without cholelithiasis. Favor reactive changes (potentially from patient's pancreatitis) over cholecystitis from occult stone.   Electronically Signed   By: Monte Fantasia M.D.   On: 09/23/2014 06:24     Management plans discussed with the patient and  in agreement.  CODE STATUS:     Code Status Orders        Start     Ordered   09/23/14 1036  Full code   Continuous     09/23/14 1035      TOTAL TIME TAKING CARE OF THIS PATIENT: 40 minutes.    Junette Bernat M.D on 09/24/2014 at 10:19 AM  Between 7am to 6pm - Pager - [phone] After 6pm go to [email or web address] - password [email or web address] [facility]  [place] [facility]  Office  [phone]  CC: Primary care physician; No primary care provider on file.

## 2014-10-07 ENCOUNTER — Ambulatory Visit: Payer: Self-pay

## 2014-10-22 ENCOUNTER — Emergency Department: Payer: Self-pay

## 2014-10-22 ENCOUNTER — Encounter: Payer: Self-pay | Admitting: Emergency Medicine

## 2014-10-22 ENCOUNTER — Inpatient Hospital Stay
Admission: EM | Admit: 2014-10-22 | Discharge: 2014-10-26 | DRG: 439 | Disposition: A | Discharge status: Home / Self Care | Payer: Self-pay | Attending: Internal Medicine | Admitting: Internal Medicine

## 2014-10-22 DIAGNOSIS — K811 Chronic cholecystitis: Secondary | ICD-10-CM | POA: Diagnosis present

## 2014-10-22 DIAGNOSIS — R1013 Epigastric pain: Secondary | ICD-10-CM | POA: Diagnosis present

## 2014-10-22 DIAGNOSIS — I129 Hypertensive chronic kidney disease with stage 1 through stage 4 chronic kidney disease, or unspecified chronic kidney disease: Secondary | ICD-10-CM | POA: Diagnosis present

## 2014-10-22 DIAGNOSIS — R809 Proteinuria, unspecified: Secondary | ICD-10-CM | POA: Diagnosis present

## 2014-10-22 DIAGNOSIS — K852 Alcohol induced acute pancreatitis without necrosis or infection: Secondary | ICD-10-CM | POA: Diagnosis present

## 2014-10-22 DIAGNOSIS — T39395A Adverse effect of other nonsteroidal anti-inflammatory drugs [NSAID], initial encounter: Secondary | ICD-10-CM | POA: Diagnosis present

## 2014-10-22 DIAGNOSIS — N179 Acute kidney failure, unspecified: Secondary | ICD-10-CM

## 2014-10-22 DIAGNOSIS — F1010 Alcohol abuse, uncomplicated: Secondary | ICD-10-CM | POA: Diagnosis present

## 2014-10-22 DIAGNOSIS — R319 Hematuria, unspecified: Secondary | ICD-10-CM | POA: Diagnosis present

## 2014-10-22 DIAGNOSIS — I10 Essential (primary) hypertension: Secondary | ICD-10-CM | POA: Diagnosis present

## 2014-10-22 DIAGNOSIS — K859 Acute pancreatitis without necrosis or infection, unspecified: Secondary | ICD-10-CM

## 2014-10-22 DIAGNOSIS — H6690 Otitis media, unspecified, unspecified ear: Secondary | ICD-10-CM | POA: Diagnosis present

## 2014-10-22 DIAGNOSIS — R109 Unspecified abdominal pain: Secondary | ICD-10-CM

## 2014-10-22 DIAGNOSIS — N183 Chronic kidney disease, stage 3 (moderate): Secondary | ICD-10-CM | POA: Diagnosis present

## 2014-10-22 DIAGNOSIS — R1011 Right upper quadrant pain: Secondary | ICD-10-CM

## 2014-10-22 DIAGNOSIS — E876 Hypokalemia: Secondary | ICD-10-CM | POA: Diagnosis present

## 2014-10-22 DIAGNOSIS — Z79899 Other long term (current) drug therapy: Secondary | ICD-10-CM

## 2014-10-22 DIAGNOSIS — E860 Dehydration: Secondary | ICD-10-CM | POA: Diagnosis present

## 2014-10-22 HISTORY — DX: Essential (primary) hypertension: I10

## 2014-10-22 HISTORY — DX: Acute kidney failure, unspecified: N17.9

## 2014-10-22 HISTORY — DX: Epigastric pain: R10.13

## 2014-10-22 LAB — COMPREHENSIVE METABOLIC PANEL
ALK PHOS: 62 U/L (ref 38–126)
ALT: 19 U/L (ref 17–63)
AST: 21 U/L (ref 15–41)
Albumin: 3.4 g/dL — ABNORMAL LOW (ref 3.5–5.0)
Anion gap: 13 (ref 5–15)
BILIRUBIN TOTAL: 0.7 mg/dL (ref 0.3–1.2)
BUN: 27 mg/dL — ABNORMAL HIGH (ref 6–20)
CO2: 25 mmol/L (ref 22–32)
CREATININE: 2.7 mg/dL — AB (ref 0.61–1.24)
Calcium: 9.1 mg/dL (ref 8.9–10.3)
Chloride: 99 mmol/L — ABNORMAL LOW (ref 101–111)
GFR calc non Af Amer: 25 mL/min — ABNORMAL LOW (ref >60)
GFR, EST AFRICAN AMERICAN: 30 mL/min — AB (ref >60)
GLUCOSE: 107 mg/dL — AB (ref 65–99)
Potassium: 3 mmol/L — ABNORMAL LOW (ref 3.5–5.1)
Sodium: 137 mmol/L (ref 135–145)
Total Protein: 8.3 g/dL — ABNORMAL HIGH (ref 6.5–8.1)

## 2014-10-22 LAB — URINALYSIS COMPLETE WITH MICROSCOPIC (ARMC ONLY)
Bilirubin Urine: NEGATIVE
Glucose, UA: 50 mg/dL — AB
Ketones, ur: NEGATIVE mg/dL
Leukocytes, UA: NEGATIVE
Nitrite: NEGATIVE
PROTEIN: 30 mg/dL — AB
SPECIFIC GRAVITY, URINE: 1.019 (ref 1.005–1.030)
pH: 5 (ref 5.0–8.0)

## 2014-10-22 LAB — CBC
HCT: 32.8 % — ABNORMAL LOW (ref 40.0–52.0)
Hemoglobin: 10.9 g/dL — ABNORMAL LOW (ref 13.0–18.0)
MCH: 30.4 pg (ref 26.0–34.0)
MCHC: 33.4 g/dL (ref 32.0–36.0)
MCV: 91 fL (ref 80.0–100.0)
Platelets: 304 10*3/uL (ref 150–440)
RBC: 3.6 MIL/uL — ABNORMAL LOW (ref 4.40–5.90)
RDW: 13.3 % (ref 11.5–14.5)
WBC: 11.4 10*3/uL — ABNORMAL HIGH (ref 3.8–10.6)

## 2014-10-22 LAB — LIPASE, BLOOD: Lipase: 51 U/L (ref 22–51)

## 2014-10-22 MED ORDER — CLONIDINE HCL 0.1 MG PO TABS: 0.1000 mg | ORAL_TABLET | Freq: Two times a day (BID) | ORAL | Status: DC

## 2014-10-22 MED ORDER — HEPARIN SODIUM (PORCINE) 5000 UNIT/ML IJ SOLN
5000.0000 [IU] | Freq: Three times a day (TID) | INTRAMUSCULAR | Status: DC
  Administered 2014-10-22 – 2014-10-26 (×12): 5000 [IU] via SUBCUTANEOUS
  Filled 2014-10-22 (×12): qty 1

## 2014-10-22 MED ORDER — ONDANSETRON HCL 4 MG/2ML IJ SOLN
4.0000 mg | Freq: Four times a day (QID) | INTRAMUSCULAR | Status: DC | PRN
Start: 2014-10-22 — End: 2014-10-26
  Administered 2014-10-25: 03:00:00 4 mg via INTRAVENOUS
  Filled 2014-10-22: qty 2

## 2014-10-22 MED ORDER — POTASSIUM CHLORIDE 10 MEQ/100ML IV SOLN
10.0000 meq | Freq: Once | INTRAVENOUS | Status: AC
  Administered 2014-10-22: 14:00:00 10 meq via INTRAVENOUS
  Filled 2014-10-22: qty 100

## 2014-10-22 MED ORDER — ALUM & MAG HYDROXIDE-SIMETH 200-200-20 MG/5ML PO SUSP: 30.0000 mL | Freq: Four times a day (QID) | ORAL | Status: DC | PRN

## 2014-10-22 MED ORDER — PANTOPRAZOLE SODIUM 40 MG IV SOLR
40.0000 mg | Freq: Two times a day (BID) | INTRAVENOUS | Status: DC
  Administered 2014-10-22 – 2014-10-26 (×9): 40 mg via INTRAVENOUS
  Filled 2014-10-22 (×9): qty 40

## 2014-10-22 MED ORDER — CLONIDINE HCL 0.1 MG PO TABS
0.1000 mg | ORAL_TABLET | Freq: Three times a day (TID) | ORAL | Status: DC
  Administered 2014-10-22 – 2014-10-26 (×11): 0.1 mg via ORAL
  Filled 2014-10-22 (×11): qty 1

## 2014-10-22 MED ORDER — ACETAMINOPHEN 325 MG PO TABS: 650.0000 mg | ORAL_TABLET | Freq: Four times a day (QID) | ORAL | Status: DC | PRN

## 2014-10-22 MED ORDER — ONDANSETRON HCL 4 MG PO TABS: 4.0000 mg | ORAL_TABLET | Freq: Four times a day (QID) | ORAL | Status: DC | PRN

## 2014-10-22 MED ORDER — SODIUM CHLORIDE 0.9 % IV SOLN
1000.0000 mL | Freq: Once | INTRAVENOUS | Status: AC
  Administered 2014-10-22: 1000 mL via INTRAVENOUS

## 2014-10-22 MED ORDER — FOLIC ACID 1 MG PO TABS
1.0000 mg | ORAL_TABLET | Freq: Every day | ORAL | Status: DC
Start: 2014-10-23 — End: 2014-10-26
  Administered 2014-10-23 – 2014-10-26 (×4): 1 mg via ORAL
  Filled 2014-10-22 (×4): qty 1

## 2014-10-22 MED ORDER — HYDRALAZINE HCL 20 MG/ML IJ SOLN
10.0000 mg | Freq: Four times a day (QID) | INTRAMUSCULAR | Status: DC | PRN
  Administered 2014-10-22 – 2014-10-24 (×2): 10 mg via INTRAVENOUS
  Filled 2014-10-22 (×2): qty 1

## 2014-10-22 MED ORDER — SODIUM CHLORIDE 0.9 % IV SOLN
INTRAVENOUS | Status: DC
  Administered 2014-10-22 – 2014-10-23 (×3): via INTRAVENOUS

## 2014-10-22 MED ORDER — GI COCKTAIL ~~LOC~~
ORAL | Status: AC
Start: 2014-10-22 — End: 2014-10-22
  Administered 2014-10-22: 30 mL via ORAL
  Filled 2014-10-22: qty 30

## 2014-10-22 MED ORDER — GI COCKTAIL ~~LOC~~
30.0000 mL | Freq: Once | ORAL | Status: AC
  Administered 2014-10-22: 30 mL via ORAL

## 2014-10-22 MED ORDER — ACETAMINOPHEN 650 MG RE SUPP: 650.0000 mg | Freq: Four times a day (QID) | RECTAL | Status: DC | PRN

## 2014-10-22 MED ORDER — MORPHINE SULFATE 2 MG/ML IJ SOLN
1.0000 mg | INTRAMUSCULAR | Status: DC | PRN
  Administered 2014-10-22 – 2014-10-26 (×13): 1 mg via INTRAVENOUS
  Filled 2014-10-22 (×13): qty 1

## 2014-10-22 NOTE — Plan of Care (Signed)
Problem: Discharge Progression Outcomes Goal: Other Discharge Outcomes/Goals Outcome: Progressing Plan of Care Progress to Goal:  Pt admitted for acute abd pain.  He has had no c/o while on the floor.  Will have GI consult and nephrology consult.  For the last 2 wks, pt had not been voiding very much.  Also reports that he's lost approx 8 lbs in last month.

## 2014-10-22 NOTE — Plan of Care (Signed)
Problem: Discharge Progression Outcomes Goal: Discharge plan in place and appropriate Outcome: Progressing Individualization: Pt lives w/girlfriend, Joelene Millin.  Works at [employer], five years.  A&O, independent.  Was here 7/3-5 for pancreatitis.

## 2014-10-22 NOTE — Consult Note (Signed)
GI Inpatient Consult Note  Reason for Consult: Epigastric pain, pancreatitis 1 month ago   Attending Requesting Consult: Reece Levy  History of Present Illness: Paul Howard is a 54 y.o. male with history of HTN and pancreatitis secondary to EtOH 09/2014 admitted for elevated BUN and Cr.  Patient presented to the [facility] ED complaining of epigastric discomfort soon after eating cereal and milk.  Associated symptoms included mild nausea.  Patient also reported one episode of vomiting 2-3 days ago after eating spicy food.  Patient was concerned because he was hospitalized with acute alcohol-induced pancreatitis 7/4-09/24/14.  He improved clinically and tolerated a clear liquid diet, so he was discharged with instructions to abstain from alcohol.  Cr noted at 1.5 upon discharge.  In the ED, lipase was normal but Cr 2.7 and BUN 27 without clinical signs of dehydration.  Patient was admitted for IV fluids and further evaluation.  Per patient, last EtOH was last week.  Today, patient reports continued epigastric pain and mild nausea.  He describes the epigastric pain as sharp, worse after meals such as pizza, and persistent.  Patient states initially the pain felt similar to when he had pancreatitis, but it has eased off a bit since admission.  After discharge, patient notes he felt pretty well for about a week, but then started having the mild abdominal pain and nausea.  He endorses occasional vomiting over the last few weeks, but denies hematemesis.  Patient also reports reflux, particularly after eating spicy or greasy foods.  He does not currently take a PPI at home.  He does admit to taking NSAIDs regularly for musculoskeletal pain.  Associated symptoms include decreased stooling/constipation and decreased urination.  He has not seen BRB in the stool or melena.  Notably, patient has also lost 11 lbs since discharge on 09/24/2014.  He denies alarm symptoms such pain while swallowing, h/o anemia, melena, and chest  pain.  Patient also denies dysphagia, diarrhea, hematochezia, and melena.  Last EGD - none prior Last colonoscopy - none prior  X-ray abdomen - negative for acute intra-abd pathology RUQ [place] - (09/23/2014) mild gallbladder wall thickening and edema w/o cholelithiasis, favor reactive changes from pancreatitis over cholecystitis from occult stone  Past Medical History:  Past Medical History  Diagnosis Date  . Hypertension   . Pancreatitis     Problem List: Patient Active Problem List   Diagnosis Date Noted  . Acute epigastric pain 10/22/2014  . AKI (acute kidney injury) 10/22/2014  . HTN (hypertension) 10/22/2014  . Pancreatitis, alcoholic, acute 0000000    Past Surgical History: Past Surgical History  Procedure Laterality Date  . Tonsillectomy      Allergies: No Known Allergies  Home Medications: Prescriptions prior to admission  Medication Sig Dispense Refill Last Dose  . cloNIDine (CATAPRES) 0.1 MG tablet Take 1 tablet (0.1 mg total) by mouth 2 (two) times daily. 60 tablet 11 10/22/2014 at Unknown time  . folic acid (FOLVITE) 1 MG tablet Take 1 tablet (1 mg total) by mouth daily. 30 tablet 0 10/22/2014 at Unknown time   Home medication reconciliation was completed with the patient.   Scheduled Inpatient Medications:   . cloNIDine  0.1 mg Oral BID  . [START ON [id number] folic acid  1 mg Oral Daily  . heparin  5,000 Units Subcutaneous 3 times per day  . pantoprazole (PROTONIX) IV  40 mg Intravenous Q12H    Continuous Inpatient Infusions:   . sodium chloride 125 mL/hr at 10/22/14 1358  PRN Inpatient Medications:  acetaminophen **OR** acetaminophen, alum & mag hydroxide-simeth, morphine injection, ondansetron **OR** ondansetron (ZOFRAN) IV  Family History: family history includes Hypertension in his mother; Throat cancer in his father.  The patient's family history is negative for inflammatory bowel disorders, GI malignancy, or solid organ transplantation.  Social  History:   reports that he has never smoked. He does not have any smokeless tobacco history on file. He reports that he drinks about 1.8 oz of alcohol per week. He reports that he uses illicit drugs (Cocaine).   Review of Systems: Constitutional: Weight is stable. + fatigue, weakness  Eyes: No changes in vision. ENT: No oral lesions, sore throat.  GI: see HPI.  Heme/Lymph: No easy bruising.  CV: No chest pain.  GU: No hematuria.  Integumentary: No rashes.  Neuro: No headaches.  Psych: No depression/anxiety.  Endocrine: No heat/cold intolerance.  Allergic/Immunologic: No urticaria.  Resp: No cough, SOB.  Musculoskeletal: No joint swelling.    Physical Examination: BP 165/85 mmHg  Pulse 79  Temp(Src) 98.4 F (36.9 C) (Oral)  Resp 20  Ht 5\' 6"  (1.676 m)  Wt 72.576 kg (160 lb)  BMI 25.84 kg/m2  SpO2 100% Gen: NAD, alert and oriented x 4 HEENT: PEERLA, EOMI, Neck: supple, no JVD or thyromegaly Chest: CTA bilaterally, no wheezes, crackles, or other adventitious sounds CV: RRR, no m/g/c/r Abd: soft, mild epigastric > periumbilical tenderness to palpation, ND, +BS in all four quadrants; no HSM, guarding, ridigity, or rebound tenderness Ext: no edema, well perfused with 2+ pulses Skin: no rash or lesions noted Lymph: no LAD  Data: Lab Results  Component Value Date   WBC 11.4* 10/22/2014   HGB 10.9* 10/22/2014   HCT 32.8* 10/22/2014   MCV 91.0 10/22/2014   PLT 304 10/22/2014    Recent Labs Lab 10/22/14 0959  HGB 10.9*   Lab Results  Component Value Date   NA 137 10/22/2014   K 3.0* 10/22/2014   CL 99* 10/22/2014   CO2 25 10/22/2014   BUN 27* 10/22/2014   CREATININE 2.70* 10/22/2014   Lab Results  Component Value Date   ALT 19 10/22/2014   AST 21 10/22/2014   ALKPHOS 62 10/22/2014   BILITOT 0.7 10/22/2014   No results for input(s): APTT, INR, PTT in the last 168 hours. Assessment/Plan: Paul Howard is a 54 y.o. male with history of HTN and pancreatitis  secondary to EtOH 09/2014 admitted for elevated BUN and Cr.   Patient does present with some concerning symptoms including 11 lb weight loss in one month, decreased appetite, continued epigastric pain, and persistent nausea.  Possible etiologies to r/o include cholecystitis from an occult stone vs post-pancreatitis complications.  RUQ [place] on 09/23/14 did show abnormal gallbladder with thickening to 88mm, but this was attributed to reactive changes from pancreatitis rather than an cholecystitis.  Will order RUQ [place] to re-evaluate gallbladder.  We will hold off on an CT abd/pelvis for now, as it would need to be with contrast to see the pancreas well and patient's BUN and Cr have significantly worsened since previous admisision.  If these improved with continued IV hydration, will order to r/o pancreatic etiology. Other possible causes to consider if RUQ [place] and/or CT abd/pelvis include peptic ulcer due to significant NSAID use and gastritis.   Recommendations:  1. Epigastric pain - RUQ [place] ordered to re-evaluate gallbladder, remain NPO - Will consider CT abdomen/pelvis with contrast to r/o pancreatitis complications if kidney function improves with IV fluids -  Continue IV Protonix 40mg  q 12 hrs - Further recs pending [place] results and BUN/Cr drawn tomorrow morning   Thank you for the consult. We will follow along with you. Please call with questions or concerns.  Lavera Guise, PA-C [facility] Phone: [phone] Pager: [phone]

## 2014-10-22 NOTE — ED Notes (Signed)
Patient presents to the ED with upper abdominal pain/epigastric pain that began one hour ago after eating cereal with milk.  Patient states he was admitted to the hospital about 1 month ago for acute pancreatitis.  Patient states pain feels the same.  Patient denies any alcohol intake.  Patient denies vomiting and diarrhea.  Patient reports a small bm today but reports constipation in general.

## 2014-10-22 NOTE — Consult Note (Signed)
[facility]  CONSULT NOTE    Date: 10/22/2014                  Patient Name:  Paul Howard  MRN: [id number]  DOB: Nov 20, 1960  Age / Sex: 54 y.o., male         PCP: No primary care provider on file.                 Service Requesting Consult: Dr. Reece Levy                 Reason for Consult: Acute Renal failure            History of Present Illness: Paul Howard is a 54 y.o. black male with hypertension, cocaine abuse, alcohol abuse, history of alcoholic pancreatitis who was admitted to [facility] on 10/22/2014 for Acute epigastric pain.  Patient's common law wife is at bedside who assists with history taking. Patient was admitted to [facility] from 7/4 to 7/5 for acute alcoholic pancreatitis. On discharge, patient's creatinine was 1.67 with eGFR of 53. Patient has not been eating or drinking since his discharge. He states that he is scared to eat and has no appetite. Readmitted today for concern that he had reoccurrence of pancreatitis. Lipase is 51.  Patient states that he takes Advil several times a day. He states he did not know this was bad for his kidneys.  Patient found to have a albumin of 3.4 on admission.  Patient is hypertensive. He states that he was diagnosed with hypertension several years back. However he started on clonidine on last admission. He has been taking this. He does not currently have a primary care physician.     Medications: Outpatient medications: Prescriptions prior to admission  Medication Sig Dispense Refill Last Dose  . cloNIDine (CATAPRES) 0.1 MG tablet Take 1 tablet (0.1 mg total) by mouth 2 (two) times daily. 60 tablet 11 10/22/2014 at Unknown time  . folic acid (FOLVITE) 1 MG tablet Take 1 tablet (1 mg total) by mouth daily. 30 tablet 0 10/22/2014 at Unknown time    Current medications: Current Facility-Administered Medications  Medication Dose Route Frequency Provider Last Rate Last Dose  . 0.9 %  sodium chloride infusion    Intravenous Continuous Juluis Mire, MD 125 mL/hr at 10/22/14 1358    . acetaminophen (TYLENOL) tablet 650 mg  650 mg Oral Q6H PRN Juluis Mire, MD       Or  . acetaminophen (TYLENOL) suppository 650 mg  650 mg Rectal Q6H PRN Juluis Mire, MD      . alum & mag hydroxide-simeth (MAALOX/MYLANTA) 200-200-20 MG/5ML suspension 30 mL  30 mL Oral Q6H PRN Juluis Mire, MD      . cloNIDine (CATAPRES) tablet 0.1 mg  0.1 mg Oral TID Lavonia Dana, MD      . Derrill Memo ON 04/23/9796] folic acid (FOLVITE) tablet 1 mg  1 mg Oral Daily Juluis Mire, MD      . heparin injection 5,000 Units  5,000 Units Subcutaneous 3 times per day Juluis Mire, MD   5,000 Units at 10/22/14 1404  . morphine 2 MG/ML injection 1 mg  1 mg Intravenous Q4H PRN Juluis Mire, MD      . ondansetron [facility]) tablet 4 mg  4 mg Oral Q6H PRN Juluis Mire, MD       Or  . ondansetron [facility]) injection 4 mg  4 mg Intravenous  Q6H PRN Juluis Mire, MD      . pantoprazole (PROTONIX) injection 40 mg  40 mg Intravenous Q12H Juluis Mire, MD   40 mg at 10/22/14 1404      Allergies: No Known Allergies    Past Medical History: Past Medical History  Diagnosis Date  . Hypertension   . Pancreatitis      Past Surgical History: Past Surgical History  Procedure Laterality Date  . Tonsillectomy       Family History: Family History  Problem Relation Age of Onset  . Hypertension Mother   . Throat cancer Father      Social History: History   Social History  . Marital Status: Unknown    Spouse Name: N/A  . Number of Children: N/A  . Years of Education: N/A   Occupational History  . Not on file.   Social History Main Topics  . Smoking status: Never Smoker   . Smokeless tobacco: Not on file  . Alcohol Use: 1.8 oz/week    3 Cans of beer per week  . Drug Use: Yes    Special: Cocaine  . Sexual Activity: Not on file   Other Topics Concern  . Not on file   Social History Narrative      Review of Systems: Review of Systems  Constitutional: Negative for fever, chills, weight loss, malaise/fatigue and diaphoresis.  HENT: Negative for congestion, ear discharge, ear pain, hearing loss, nosebleeds, sore throat and tinnitus.   Eyes: Negative for blurred vision, double vision, photophobia, pain, discharge and redness.  Respiratory: Negative for cough, hemoptysis, sputum production, shortness of breath, wheezing and stridor.   Cardiovascular: Negative for chest pain, palpitations, orthopnea, claudication, leg swelling and PND.  Gastrointestinal: Positive for abdominal pain and constipation. Negative for heartburn, nausea, vomiting, diarrhea, blood in stool and melena.  Genitourinary: Negative for dysuria, urgency, frequency, hematuria and flank pain.  Musculoskeletal: Negative for myalgias, back pain, joint pain, falls and neck pain.  Skin: Negative for itching and rash.  Neurological: Negative for dizziness, tingling, tremors, sensory change, speech change, focal weakness, seizures, loss of consciousness, weakness and headaches.  Endo/Heme/Allergies: Negative for environmental allergies and polydipsia. Does not bruise/bleed easily.  Psychiatric/Behavioral: Negative for depression, suicidal ideas, hallucinations, memory loss and substance abuse. The patient is not nervous/anxious and does not have insomnia.     Vital Signs: Blood pressure 191/118, pulse 86, temperature 98.4 F (36.9 C), temperature source Oral, resp. rate 20, height _0  (1.676 m), weight 72.576 kg (160 lb), SpO2 100 %.  Weight trends: Filed Weights   10/22/14 0955  Weight: 72.576 kg (160 lb)    Physical Exam: General: NAD,   Head: Normocephalic, atraumatic. Moist oral mucosal membranes  Eyes: Anicteric, PERRL  Neck: Supple, trachea midline  Lungs:  Clear to auscultation  Heart: Regular rate and rhythm  Abdomen:  Soft, mild tenderness on epigastric region  Extremities: no peripheral edema   Neurologic: Nonfocal, moving all four extremities  Skin: No lesions        Lab results: Basic Metabolic Panel:  Recent Labs Lab 10/22/14 0959  NA 137  K 3.0*  CL 99*  CO2 25  GLUCOSE 107*  BUN 27*  CREATININE 2.70*  CALCIUM 9.1    Liver Function Tests:  Recent Labs Lab 10/22/14 0959  AST 21  ALT 19  ALKPHOS 62  BILITOT 0.7  PROT 8.3*  ALBUMIN 3.4*    Recent Labs Lab 10/22/14 0959  LIPASE 51   No  results for input(s): AMMONIA in the last 168 hours.  CBC:  Recent Labs Lab 10/22/14 0959  WBC 11.4*  HGB 10.9*  HCT 32.8*  MCV 91.0  PLT 304    Cardiac Enzymes: No results for input(s): CKTOTAL, CKMB, CKMBINDEX, TROPONINI in the last 168 hours.  BNP: Invalid input(s): POCBNP  CBG: No results for input(s): GLUCAP in the last 168 hours.  Microbiology: No results found for this or any previous visit.  Coagulation Studies: No results for input(s): LABPROT, INR in the last 72 hours.  Urinalysis:  Recent Labs  10/22/14 1312  COLORURINE YELLOW*  LABSPEC 1.019  PHURINE 5.0  GLUCOSEU 50*  HGBUR 1+*  BILIRUBINUR NEGATIVE  KETONESUR NEGATIVE  PROTEINUR 30*  NITRITE NEGATIVE  LEUKOCYTESUR NEGATIVE      Imaging: Dg Abd 2 Views  10/22/2014   CLINICAL DATA:  54 year old male with pancreatitis 1 month ago. Abdominal pain since. Initial encounter.  EXAM: ABDOMEN - 2 VIEW  COMPARISON:  No comparison abdominal films. Comparison lumbar spine exam 08/16/2013.  FINDINGS: No plain film evidence of bowel obstruction or free intraperitoneal air.  Calcifications to the right of the L2 vertebral body may be related to pancreatitis. Limited for evaluating for possibility of underlying inflammatory process by plain film exam.  Prominent bilateral hip joint degenerative changes.  IMPRESSION: No plain film evidence of bowel obstruction or free intraperitoneal air.  Calcifications to the right of the L2 vertebral body may be related to pancreatitis. Limited for  evaluating for possibility of underlying inflammatory process by plain film exam.  Prominent bilateral hip joint degenerative changes.   Electronically Signed   By: Genia Del M.D.   On: 10/22/2014 11:59      Assessment & Plan: Paul Howard is a 54 y.o. black male with hypertension, cocaine abuse, alcohol abuse, history of alcoholic pancreatitis who was admitted to [facility] on 10/22/2014 for Acute epigastric pain.   1. Acute Renal Failure on chronic kidney disease stage III: with hematuria and proteinuria. Acute renal failure seems to be due to prerenal azotemia via history. Agree with IV fluids. Monitor volume status and appreciate GI consult for acute pancreatitis. Chronic kidney disease is due to hypertensive kidney disease and NSAID.  - check renal ultrasound - Monitor renal funciton - educated about avoiding nephrotoxic agents.   2. Hypertension: not well controlled. Clonidine as outpatient. Hydralazine ordered PRN - increase dose of clonidine. May benefit from a beta blocker.      LOS: 0 Lenwood Balsam 8/2/20164:33 PM

## 2014-10-22 NOTE — ED Provider Notes (Signed)
[facility] Emergency Department Provider Note  ____________________________________________  Time seen: On arrival  I have reviewed the triage vital signs and the nursing notes.   HISTORY  Chief Complaint Abdominal Pain    HPI Paul Howard is a 54 y.o. male who presents with epigastric discomfort that started about 1-2 hours ago after eating a cereal with milk. He is concerned because he was in the hospital recently with acute pancreatitis. He reports a history of high blood pressure as well and notes he was only recently put on blood pressure medication which she has been taking. The last time he drank alcohol was last week. He reports normal stools but mild nausea. No history of acid reflux or ulcers. No fevers chills     Past Medical History  Diagnosis Date  . Hypertension   . Pancreatitis     Patient Active Problem List   Diagnosis Date Noted  . Pancreatitis, alcoholic, acute 0000000    Past Surgical History  Procedure Laterality Date  . Tonsillectomy    . No past surgeries      Current Outpatient Rx  Name  Route  Sig  Dispense  Refill  . cloNIDine (CATAPRES) 0.1 MG tablet   Oral   Take 1 tablet (0.1 mg total) by mouth 2 (two) times daily.   60 tablet   11   . folic acid (FOLVITE) 1 MG tablet   Oral   Take 1 tablet (1 mg total) by mouth daily.   30 tablet   0   . HYDROcodone-acetaminophen (NORCO/VICODIN) 5-325 MG per tablet   Oral   Take 1-2 tablets by mouth every 4 (four) hours as needed for moderate pain.   30 tablet   0   . Multiple Vitamin (MULTIVITAMIN WITH MINERALS) TABS tablet   Oral   Take 1 tablet by mouth daily.   30 tablet   0     Allergies Review of patient's allergies indicates no known allergies.  Family History  Problem Relation Age of Onset  . Hypertension Mother   . Throat cancer Father     Social History History  Substance Use Topics  . Smoking status: Never Smoker   . Smokeless tobacco:  Not on file  . Alcohol Use: 1.8 oz/week    3 Cans of beer per week    Review of Systems  Constitutional: Negative for fever. Eyes: Negative for visual changes. ENT: Negative for sore throat Cardiovascular: Negative for chest pain. Respiratory: Negative for shortness of breath. Gastrointestinal: Abdominal pain, nausea Genitourinary: Negative for dysuria. Musculoskeletal: Negative for back pain. Skin: Negative for rash. Neurological: Negative for headaches or focal weakness Psychiatric: No anxiety    ____________________________________________   PHYSICAL EXAM:  VITAL SIGNS: ED Triage Vitals  Enc Vitals Group     BP 10/22/14 0955 142/82 mmHg     Pulse Rate 10/22/14 0955 87     Resp 10/22/14 0955 20     Temp 10/22/14 0955 98.2 F (36.8 C)     Temp Source 10/22/14 0955 Oral     SpO2 10/22/14 0955 98 %     Weight 10/22/14 0955 160 lb (72.576 kg)     Height 10/22/14 0955 5\' 6"  (1.676 m)     Head Cir --      Peak Flow --      Pain Score 10/22/14 0956 8     Pain Loc --      Pain Edu? --      Excl.  in [place]? --      Constitutional: Alert and oriented. Well appearing but uncomfortable Eyes: Conjunctivae are normal.  ENT   Head: Normocephalic and atraumatic.   Mouth/Throat: Mucous membranes are moist. Cardiovascular: Normal rate, regular rhythm. Normal and symmetric distal pulses are present in all extremities. No murmurs, rubs, or gallops. Respiratory: Normal respiratory effort without tachypnea nor retractions. Breath sounds are clear and equal bilaterally.  Gastrointestinal: Mild tenderness to palpation in the epigastrium. No distention. There is no CVA tenderness. Genitourinary: deferred Musculoskeletal: Nontender with normal range of motion in all extremities. No lower extremity tenderness nor edema. Neurologic:  Normal speech and language. No gross focal neurologic deficits are appreciated. Skin:  Skin is warm, dry and intact. No rash noted. Psychiatric: Mood  and affect are normal. Patient exhibits appropriate insight and judgment.  ____________________________________________    LABS (pertinent positives/negatives)  Labs Reviewed  COMPREHENSIVE METABOLIC PANEL - Abnormal; Notable for the following:    Potassium 3.0 (*)    Chloride 99 (*)    Glucose, Bld 107 (*)    BUN 27 (*)    Creatinine, Ser 2.70 (*)    Total Protein 8.3 (*)    Albumin 3.4 (*)    GFR calc non Af Amer 25 (*)    GFR calc Af Amer 30 (*)    All other components within normal limits  CBC - Abnormal; Notable for the following:    WBC 11.4 (*)    RBC 3.60 (*)    Hemoglobin 10.9 (*)    HCT 32.8 (*)    All other components within normal limits  LIPASE, BLOOD  URINALYSIS COMPLETEWITH MICROSCOPIC ([facility] ONLY)    ____________________________________________   EKG  ED ECG REPORT I, Lavonia Drafts, the attending physician, personally viewed and interpreted this ECG.   Date: 10/22/2014  EKG Time: 10:03 AM  Rate: 81  Rhythm: normal sinus rhythm  Axis: Normal  Intervals:none  ST&T Change: Nonspecific T-wave changes   ____________________________________________    RADIOLOGY I have personally reviewed any xrays that were ordered on this patient: None  ____________________________________________   PROCEDURES  Procedure(s) performed: none  Critical Care performed: none  ____________________________________________   INITIAL IMPRESSION / ASSESSMENT AND PLAN / ED COURSE  Pertinent labs & imaging results that were available during my care of the patient were reviewed by me and considered in my medical decision making (see chart for details).  Patient's lipase is normal. He is only minimal tenderness over the epigastrium. I suspect gastritis. More concerning is his creatinine has nearly doubled over the last month. His BUN is elevated but is not clinically dehydrated. We will start IV fluids and consider  admission  ____________________________________________   FINAL CLINICAL IMPRESSION(S) / ED DIAGNOSES  Final diagnoses:  Abdominal pain  Acute renal failure, unspecified acute renal failure type     Lavonia Drafts, MD 10/22/14 1212

## 2014-10-22 NOTE — Progress Notes (Signed)
   10/22/14 1600  Clinical Encounter Type  Visited With Patient  Visit Type Initial  Referral From Nurse  Consult/Referral To Chaplain  Spiritual Encounters  Spiritual Needs Literature  Advance Directives (For Healthcare)  Does patient have an advance directive? No  Would patient like information on creating an advanced directive? Yes - Scientist, clinical (histocompatibility and immunogenetics) given  Provided pastoral presence and support to patient.  Also provided education and forms for Advance Directive-HCPOA.  Patient asked chaplain to follow up on HCPOA tomorrow afternoon.  Will do so.  Elsa [phone]

## 2014-10-22 NOTE — ED Notes (Signed)
Patient to xray.

## 2014-10-22 NOTE — H&P (Signed)
[facility] [place] NAME: Paul Howard    MR#:  [id number]  DATE OF BIRTH:  1960-06-25  DATE OF ADMISSION:  10/22/2014  PRIMARY CARE PHYSICIAN: No primary care provider on file. [facility]  REQUESTING/REFERRING PHYSICIAN: Lavonia Drafts  CHIEF COMPLAINT:   Chief Complaint  Patient presents with  . Abdominal Pain   Epigastric pain with associated nausea. HISTORY OF PRESENT ILLNESS:  Paul Howard  is a 54 y.o. male with a known history of hypertension, pancreatitis secondary to EtOH use 1 month ago presents to the emergency room with the complaint of acute onset of epigastric pain with associated nausea of one day duration. Denies any fever, chills, diarrhea, chest pain, shortness of breath, dizziness. Does admit that he had some vomitings 2-3 days ago following consuming spicy food. Denies any dysuria. Denies any recent alcohol intake. Evaluation in the ED revealed stable vital signs and workup revealed potassium 3.0, BUN/creatinine 27/2.70. Lipase 51, LFTs normal, WBC 11.4, H&H 10.9 over 32.8. X-ray of the abdomen negative for any acute intra-abdominal pathology. EKG normal sinus rhythm with ventricular rate of 81 bpm, T-wave inversions in inferior lateral leads. Off note patient's creatinine was 1.56-1.67 range in first week of July 2016 at the time of his pancreatitis. She was started on IV fluids, given pain medications and hospitalist service was consulted for further management of acute renal failure. At the current time patient is resting with reasonable, 40 in the bed and states abdominal pain is under control. Patient denies any history of kidney problems in the past.  PAST MEDICAL HISTORY:   Past Medical History  Diagnosis Date  . Hypertension   . Pancreatitis     PAST SURGICAL HISTORY:   Past Surgical History  Procedure Laterality Date  . Tonsillectomy      SOCIAL HISTORY:   History  Substance Use Topics  .  Smoking status: Never Smoker   . Smokeless tobacco: Not on file  . Alcohol Use: 1.8 oz/week    3 Cans of beer per week    FAMILY HISTORY:   Family History  Problem Relation Age of Onset  . Hypertension Mother   . Throat cancer Father     DRUG ALLERGIES:  No Known Allergies  REVIEW OF SYSTEMS:   Review of Systems  Constitutional: Positive for malaise/fatigue. Negative for fever and chills.  HENT: Negative for ear pain, hearing loss, nosebleeds, sore throat and tinnitus.   Eyes: Negative for blurred vision, double vision, pain, discharge and redness.  Respiratory: Negative for cough, hemoptysis, sputum production, shortness of breath and wheezing.   Cardiovascular: Negative for chest pain, palpitations, orthopnea and leg swelling.  Gastrointestinal: Positive for nausea and abdominal pain. Negative for vomiting, diarrhea, constipation, blood in stool and melena.  Genitourinary: Negative for dysuria, urgency, frequency and hematuria.  Musculoskeletal: Negative for back pain, joint pain and neck pain.  Skin: Negative for itching and rash.  Neurological: Negative for dizziness, tingling, sensory change, focal weakness and seizures.  Endo/Heme/Allergies: Does not bruise/bleed easily.  Psychiatric/Behavioral: Negative for depression. The patient is not nervous/anxious.     MEDICATIONS AT HOME:   Prior to Admission medications   Medication Sig Start Date End Date Taking? Authorizing Provider  cloNIDine (CATAPRES) 0.1 MG tablet Take 1 tablet (0.1 mg total) by mouth 2 (two) times daily. 09/24/14  Yes Fritzi Mandes, MD  folic acid (FOLVITE) 1 MG tablet Take 1 tablet (1 mg total) by mouth daily. 09/24/14  Yes Fritzi Mandes, MD      VITAL SIGNS:  Blood pressure 148/87, pulse 58, temperature 98.6 F (37 C), temperature source Oral, resp. rate 18, height 5\' 6"  (1.676 m), weight 72.576 kg (160 lb), SpO2 99 %.  PHYSICAL EXAMINATION:  Physical Exam  Constitutional: He is oriented to person,  place, and time. He appears well-developed and well-nourished.  In mild distress because of abdominal pain +  HENT:  Head: Normocephalic and atraumatic.  Right Ear: External ear normal.  Left Ear: External ear normal.  Nose: Nose normal.  Mouth/Throat: Oropharynx is clear and moist. No oropharyngeal exudate.  Eyes: EOM are normal. Pupils are equal, round, and reactive to light. No scleral icterus.  Neck: Normal range of motion. Neck supple. No JVD present. No thyromegaly present.  Cardiovascular: Normal rate, regular rhythm, normal heart sounds and intact distal pulses.  Exam reveals no friction rub.   No murmur heard. Respiratory: Effort normal and breath sounds normal. No respiratory distress. He has no wheezes. He has no rales. He exhibits no tenderness.  GI: Soft. Bowel sounds are normal. He exhibits no distension and no mass. There is tenderness. There is no rebound and no guarding.  Mild tenderness + in epigastric region.  Musculoskeletal: Normal range of motion. He exhibits no edema.  Lymphadenopathy:    He has no cervical adenopathy.  Neurological: He is alert and oriented to person, place, and time. He has normal reflexes. He displays normal reflexes. No cranial nerve deficit. He exhibits normal muscle tone.  Skin: Skin is warm. No rash noted. No erythema.  Psychiatric: He has a normal mood and affect. His behavior is normal. Thought content normal.   LABORATORY PANEL:   CBC  Recent Labs Lab 10/22/14 0959  WBC 11.4*  HGB 10.9*  HCT 32.8*  PLT 304   ------------------------------------------------------------------------------------------------------------------  Chemistries   Recent Labs Lab 10/22/14 0959  NA 137  K 3.0*  CL 99*  CO2 25  GLUCOSE 107*  BUN 27*  CREATININE 2.70*  CALCIUM 9.1  AST 21  ALT 19  ALKPHOS 62  BILITOT 0.7    ------------------------------------------------------------------------------------------------------------------  Cardiac Enzymes No results for input(s): TROPONINI in the last 168 hours. ------------------------------------------------------------------------------------------------------------------  RADIOLOGY:  Dg Abd 2 Views  10/22/2014   CLINICAL DATA:  54 year old male with pancreatitis 1 month ago. Abdominal pain since. Initial encounter.  EXAM: ABDOMEN - 2 VIEW  COMPARISON:  No comparison abdominal films. Comparison lumbar spine exam 08/16/2013.  FINDINGS: No plain film evidence of bowel obstruction or free intraperitoneal air.  Calcifications to the right of the L2 vertebral body may be related to pancreatitis. Limited for evaluating for possibility of underlying inflammatory process by plain film exam.  Prominent bilateral hip joint degenerative changes.  IMPRESSION: No plain film evidence of bowel obstruction or free intraperitoneal air.  Calcifications to the right of the L2 vertebral body may be related to pancreatitis. Limited for evaluating for possibility of underlying inflammatory process by plain film exam.  Prominent bilateral hip joint degenerative changes.   Electronically Signed   By: Genia Del M.D.   On: 10/22/2014 11:59    EKG:   Orders placed or performed during the hospital encounter of 10/22/14  . ED EKG  . ED EKG  Normal sinus rhythm with ventricular rate of 81 bpm, T-wave inversions in the inferior and lateral leads.  IMPRESSION AND PLAN:   1. Epigastric pain with associated nausea. History of spicy food intake in the recent past and patient attributes  symptoms started after spicy food intake. Likely gastritis. Less likely to be cholecystitis or pancreatitis. Plan: Nothing by mouth, IV fluids, IV pain control medications, IV Protonix. GI consultation requested for further evaluation and advice. 2. Acute kidney injury with creatinine of 2.70. Creatinine on  09/24/2014 was 1.56. Likely secondary to dehydration from GI symptoms. Plan: IV hydration, avoid nephrotoxic agents. Consider renal ultrasound if creatinine not improving. Nephrology consultation requested for further advice. 3. History of pancreatitis related to alcohol usage a month ago. Current lipase 51 which is in the normal range. Monitor for now with repeat lipase tomorrow. 4. Hypertension, stable on clonidine. Continue same. 5. Hypokalemia likely secondary to GI loss. Replace potassium, follow-up BMP.    All the records are reviewed and case discussed with ED provider. Management plans discussed with the patient, family and they are in agreement.  CODE STATUS: Full code  TOTAL TIME TAKING CARE OF THIS PATIENT: 50 minutes.    Azucena Freed N M.D on 10/22/2014 at 1:28 PM  Between 7am to 6pm - Pager - [phone]  After 6pm go to [email or web address] - password [email or web address] [facility]  [facility]  Office  [phone]  CC: Primary care physician; No primary care provider on file.

## 2014-10-23 ENCOUNTER — Inpatient Hospital Stay: Payer: Self-pay

## 2014-10-23 LAB — COMPREHENSIVE METABOLIC PANEL
ALBUMIN: 2.7 g/dL — AB (ref 3.5–5.0)
ALK PHOS: 51 U/L (ref 38–126)
ALT: 20 U/L (ref 17–63)
ANION GAP: 8 (ref 5–15)
AST: 20 U/L (ref 15–41)
BUN: 18 mg/dL (ref 6–20)
CALCIUM: 8.2 mg/dL — AB (ref 8.9–10.3)
CO2: 26 mmol/L (ref 22–32)
Chloride: 105 mmol/L (ref 101–111)
Creatinine, Ser: 1.67 mg/dL — ABNORMAL HIGH (ref 0.61–1.24)
GFR calc non Af Amer: 46 mL/min — ABNORMAL LOW (ref >60)
GFR, EST AFRICAN AMERICAN: 53 mL/min — AB (ref >60)
Glucose, Bld: 103 mg/dL — ABNORMAL HIGH (ref 65–99)
POTASSIUM: 2.8 mmol/L — AB (ref 3.5–5.1)
Sodium: 139 mmol/L (ref 135–145)
Total Bilirubin: 0.7 mg/dL (ref 0.3–1.2)
Total Protein: 6.8 g/dL (ref 6.5–8.1)

## 2014-10-23 LAB — CBC WITH DIFFERENTIAL/PLATELET
BASOS ABS: 0 10*3/uL (ref 0–0.1)
Basophils Relative: 1 %
EOS ABS: 0.1 10*3/uL (ref 0–0.7)
Eosinophils Relative: 2 %
HEMATOCRIT: 31.2 % — AB (ref 40.0–52.0)
HEMOGLOBIN: 10.4 g/dL — AB (ref 13.0–18.0)
Lymphocytes Relative: 18 %
Lymphs Abs: 1.4 10*3/uL (ref 1.0–3.6)
MCH: 30.3 pg (ref 26.0–34.0)
MCHC: 33.5 g/dL (ref 32.0–36.0)
MCV: 90.5 fL (ref 80.0–100.0)
MONOS PCT: 12 %
Monocytes Absolute: 0.9 10*3/uL (ref 0.2–1.0)
NEUTROS PCT: 67 %
Neutro Abs: 5.4 10*3/uL (ref 1.4–6.5)
Platelets: 257 10*3/uL (ref 150–440)
RBC: 3.44 MIL/uL — AB (ref 4.40–5.90)
RDW: 13.3 % (ref 11.5–14.5)
WBC: 8 10*3/uL (ref 3.8–10.6)

## 2014-10-23 LAB — MAGNESIUM: Magnesium: 1.8 mg/dL (ref 1.7–2.4)

## 2014-10-23 LAB — LIPASE, BLOOD: Lipase: 37 U/L (ref 22–51)

## 2014-10-23 MED ORDER — AMLODIPINE BESYLATE 10 MG PO TABS
10.0000 mg | ORAL_TABLET | Freq: Every day | ORAL | Status: DC
  Administered 2014-10-23 – 2014-10-26 (×4): 10 mg via ORAL
  Filled 2014-10-23 (×4): qty 1

## 2014-10-23 MED ORDER — POTASSIUM CHLORIDE IN NACL 20-0.9 MEQ/L-% IV SOLN
INTRAVENOUS | Status: DC
  Administered 2014-10-23 – 2014-10-25 (×5): via INTRAVENOUS
  Filled 2014-10-23 (×8): qty 1000

## 2014-10-23 MED ORDER — POTASSIUM CHLORIDE CRYS ER 20 MEQ PO TBCR
40.0000 meq | EXTENDED_RELEASE_TABLET | Freq: Two times a day (BID) | ORAL | Status: DC
  Administered 2014-10-23 – 2014-10-24 (×4): 40 meq via ORAL
  Filled 2014-10-23 (×4): qty 2

## 2014-10-23 NOTE — Progress Notes (Signed)
Initial Nutrition Assessment  INTERVENTION:   Medical Food Supplement Therapy: will recommend on follow if diet unable to be advanced and/or po intake poor    NUTRITION DIAGNOSIS:   Inadequate oral intake related to inability to eat as evidenced by NPO status since admission, however advanced to CL  GOAL:   Patient will meet greater than or equal to 90% of their needs  MONITOR:    (Energy Intake, Anthropomerics, Digestive System)  REASON FOR ASSESSMENT:   Malnutrition Screening Tool    ASSESSMENT:   Pt admitted with abdominal pain likely secondary to gastritis per MD note and acute kidney injury  Past Medical History  Diagnosis Date  . Hypertension   . Pancreatitis     Diet Order:  Diet clear liquid Room service appropriate?: Yes; Fluid consistency:: Thin    Current Nutrition: Pt NPO since admission  Food/Nutrition-Related History: Pt reports poor po intake for the past month eating  <50% of meals TID.   Medications: NS with KCl at 113mL/hr, KCl, Protonix, folic acid  Electrolyte/Renal Profile and Glucose Profile:   Recent Labs Lab 10/22/14 0959 10/23/14 0537  NA 137 139  K 3.0* 2.8*  CL 99* 105  CO2 25 26  BUN 27* 18  CREATININE 2.70* 1.67*  CALCIUM 9.1 8.2*  GLUCOSE 107* 103*   Protein Profile:  Recent Labs Lab 10/22/14 0959 10/23/14 0537  ALBUMIN 3.4* 2.7*    Gastrointestinal Profile: Last BM:  10/22/2014   Nutrition-Focused Physical Exam Findings: Nutrition-Focused physical exam completed. Findings are WDL for fat depletion, muscle depletion, and edema.    Weight Change: Pt weight loss of 2% in one month, on last admission   Skin:  Reviewed, no issues  Height:   Ht Readings from Last 1 Encounters:  10/22/14 5\' 6"  (1.676 m)    Weight:   Wt Readings from Last 1 Encounters:  10/23/14 167 lb (75.751 kg)   Wt Readings from Last 10 Encounters:  10/23/14     167 lb (75.751 kg)  09/23/14     171 lb 3.2 oz (77.656 kg)   BMI:   Body mass index is 26.97 kg/(m^2).  Estimated Nutritional Needs:   Kcal:  2039-2409kcals, BEE: 1545kcals, TEE: (1.1-1.3)(AF 1.2)  Protein:  76-90g (1.0-1.2g/kg)  Fluid:  1893-2212mL of fluid (25-11mL/kg)  EDUCATION NEEDS:   No education needs identified at this time  Sloan, RD, LDN Pager [phone]

## 2014-10-23 NOTE — Care Management (Signed)
[facility] application and [facility] application given  to Mr. Detloff.  Anell Barr will contact Mr. Regina after he is discharged to arrange a follow-up appointment. Shelbie Ammons RN MSN Care Management [phone]

## 2014-10-23 NOTE — Progress Notes (Signed)
GI Note:  U/s suggests chronic cholecystitis.  Needs CT a/p with contrast once renal function improved to eval for chronic panc, post panc complications including pseudocyst.

## 2014-10-23 NOTE — Progress Notes (Signed)
Critical Lab value received from [place] in the Lab of Potassium at 2.8.  Paged and spoke with Dr. Marcille Blanco who stated that he would put in orders to fix it.

## 2014-10-23 NOTE — Care Management (Signed)
Admitted to [facility] with the diagnosis of acute epigastic pain. Discharged from this facility 09/24/14. Lives with friend, Maudie Mercury. Another friend is Evette Cristal [phone]). When discharged on 09/23/13 Medication Management information was faxed to [facility] for Clonidine. Instructions and an [facility] appointment application was issued at that time.  Paul Howard lives in a boarding house and did work full time at [employer].  Sleeping at this time. NPO Scheduled for ultrasound of the abdomen. Junie Spencer RN MSN Care Management [phone]

## 2014-10-23 NOTE — Progress Notes (Signed)
[facility] [place] NAME: Paul Howard    MR#:  [id number]  DATE OF BIRTH:  1960/08/23  SUBJECTIVE:  CHIEF COMPLAINT:   Chief Complaint  Patient presents with  . Abdominal Pain   Patient here due to abdominal pain, nausea, vomiting. Noted to be in acute renal failure.  Still having some abdominal pain but no nausea and vomiting. Renal function a bit improved.  REVIEW OF SYSTEMS:    Review of Systems  Constitutional: Negative for fever and chills.  HENT: Negative for congestion and tinnitus.   Eyes: Negative for blurred vision and double vision.  Respiratory: Negative for cough, shortness of breath and wheezing.   Cardiovascular: Negative for chest pain, orthopnea and PND.  Gastrointestinal: Positive for abdominal pain. Negative for nausea, vomiting and diarrhea.  Genitourinary: Negative for dysuria and hematuria.  Neurological: Negative for dizziness, sensory change and focal weakness.  All other systems reviewed and are negative.   Nutrition: Clear liquid Tolerating Diet: Yes Tolerating PT: Ambulatory  DRUG ALLERGIES:  No Known Allergies  VITALS:  Blood pressure 162/95, pulse 70, temperature 100.1 F (37.8 C), temperature source Oral, resp. rate 19, height 5\' 6"  (1.676 m), weight 75.751 kg (167 lb), SpO2 100 %.  PHYSICAL EXAMINATION:   Physical Exam  GENERAL:  54 y.o.-year-old patient lying in the bed with no acute distress.  EYES: Pupils equal, round, reactive to light and accommodation. No scleral icterus. Extraocular muscles intact.  HEENT: Head atraumatic, normocephalic. Oropharynx and nasopharynx clear.  NECK:  Supple, no jugular venous distention. No thyroid enlargement, no tenderness.  LUNGS: Normal breath sounds bilaterally, no wheezing, rales, rhonchi. No use of accessory muscles of respiration.  CARDIOVASCULAR: S1, S2 RRR.  II/VI SEM at RSB, No rubs, gallops, clicks.  ABDOMEN: Soft, Tender in the  epigastric area, nondistended. Bowel sounds present. No organomegaly or mass.  EXTREMITIES: No cyanosis, clubbing or edema b/l.    NEUROLOGIC: Cranial nerves II through XII are intact. No focal Motor or sensory deficits b/l.   PSYCHIATRIC: The patient is alert and oriented x 3. Good Affect.  SKIN: No obvious rash, lesion, or ulcer.    LABORATORY PANEL:   CBC  Recent Labs Lab 10/23/14 0537  WBC 8.0  HGB 10.4*  HCT 31.2*  PLT 257   ------------------------------------------------------------------------------------------------------------------  Chemistries   Recent Labs Lab 10/23/14 0537  NA 139  K 2.8*  CL 105  CO2 26  GLUCOSE 103*  BUN 18  CREATININE 1.67*  CALCIUM 8.2*  AST 20  ALT 20  ALKPHOS 51  BILITOT 0.7   ------------------------------------------------------------------------------------------------------------------  Cardiac Enzymes No results for input(s): TROPONINI in the last 168 hours. ------------------------------------------------------------------------------------------------------------------  RADIOLOGY:  US Abdomen Complete  10/23/2014   EXAM: ULTRASOUND ABDOMEN COMPLETE  COMPARISON:  Abdominal films of October 22, 2014 ; report of a normal abdominal ultrasound dated December 05, 1997.  FINDINGS: Gallbladder: The gallbladder is only partially distended despite a fast for the past 9 hours. No stones are evident. There is gallbladder wall thickening to 3.8 mm. There is a small amount of pericholecystic fluid. There is no positive sonographic Murphy's sign.  Common bile duct: Diameter: 4.2 mm  Liver: The liver exhibits normal echotexture with no focal mass or ductal dilation.  IVC: No abnormality visualized.  Pancreas: Bowel gas limits evaluation of the pancreatic head and tail. The pancreatic body is unremarkable.  Spleen: Size and appearance within normal limits.  Right Kidney: Length: 12.6 cm. The renal  cortical echotexture is increased and is  approximately equal to that of the adjacent liver. There is no focal mass nor hydronephrosis.  Left Kidney: Length: 11.3 cm. The renal cortical echotexture is mildly increased. There is no focal mass nor hydronephrosis.  Abdominal aorta: The proximal and mid aorta exhibit normal caliber. The distal aorta and bifurcation are obscured by bowel gas.  Other findings: None.  IMPRESSION: 1. Limited gallbladder distention and despite fasting, gallbladder wall thickening and pericholecystic fluid, without evidence of stones. There is no positive sonographic Murphy's sign. The findings may reflect chronic cholecystitis. 2. The liver and visualized portions of the pancreas are normal. 3. Increased renal cortical echotexture consistent with medical renal disease. There is no evidence of obstruction.   Electronically Signed   By: David  Martinique M.D.   On: 10/23/2014 10:36   Dg Abd 2 Views  10/22/2014   CLINICAL DATA:  54 year old male with pancreatitis 1 month ago. Abdominal pain since. Initial encounter.  EXAM: ABDOMEN - 2 VIEW  COMPARISON:  No comparison abdominal films. Comparison lumbar spine exam 08/16/2013.  FINDINGS: No plain film evidence of bowel obstruction or free intraperitoneal air.  Calcifications to the right of the L2 vertebral body may be related to pancreatitis. Limited for evaluating for possibility of underlying inflammatory process by plain film exam.  Prominent bilateral hip joint degenerative changes.  IMPRESSION: No plain film evidence of bowel obstruction or free intraperitoneal air.  Calcifications to the right of the L2 vertebral body may be related to pancreatitis. Limited for evaluating for possibility of underlying inflammatory process by plain film exam.  Prominent bilateral hip joint degenerative changes.   Electronically Signed   By: Genia Del M.D.   On: 10/22/2014 11:59     ASSESSMENT AND PLAN:   54 year old male with past medical history of acute pancreatitis, alcohol abuse,  hypertension, who presented to the hospital due to abdominal pain, nausea, vomiting and was noted to be in acute renal failure.  #1 acute renal failure-likely secondary to nausea, vomiting. -Continue IV fluids and BUN and creatinine is improving and is close to baseline.  #2 abdominal pain-etiology unclear. Patient has a history of pancreatitis but his lipase presently is normal. -He denies recent alcohol abuse. Patient underwent abdominal ultrasound which showed evidence of chronic cholecystitis. -Continue supportive care with IV fluids, pain control, antiemetics. Continue clear liquid diet and advance as tolerated. If needed consider GI consult -Continue PPI.  #3 hypokalemia-this is likely secondary to the nausea vomiting. Continue supplement and will repeat levels in the morning. I will check a magnesium level.  #4 hypertension-continue clonidine, Norvasc. Continue as needed hydralazine.  #5 history of alcohol abuse-no evidence of alcohol withdrawal presently.    All the records are reviewed and case discussed with Care Management/Social Workerr. Management plans discussed with the patient, family and they are in agreement.  CODE STATUS: Full  DVT Prophylaxis: Heparin subcutaneous  TOTAL TIME TAKING CARE OF THIS PATIENT: 25 minutes.   POSSIBLE D/C IN 1-2 DAYS, DEPENDING ON CLINICAL CONDITION.   Henreitta Leber M.D on 10/23/2014 at 2:03 PM  Between 7am to 6pm - Pager - [phone]  After 6pm go to [email or web address] - password [email or web address] [facility]  [facility]  Office  [phone]  CC: Primary care physician; No primary care provider on file.

## 2014-10-23 NOTE — Plan of Care (Signed)
Problem: Discharge Progression Outcomes Goal: Other Discharge Outcomes/Goals Outcome: Progressing Plan of care progress to goal for: 1. Pain-pt c/o pain, prn meds given with improvement 2. Hemodynamically-             -VSS 3. Complications-no evidence of this shift 4. Diet-pt tolerating diet this shift 5. Activity-pt tolerating diet this shift

## 2014-10-23 NOTE — Progress Notes (Signed)
[facility]  ROUNDING NOTE   Subjective:   Patient states he is feeling better. Has some back pain.  Creatinine 1.67 (2.7) Potassium 2.8  Objective:  Vital signs in last 24 hours:  Temp:  [98.4 F (36.9 C)-98.6 F (37 C)] 98.4 F (36.9 C) (08/03 0546) Pulse Rate:  [53-86] 64 (08/03 0546) Resp:  [18-22] 22 (08/03 0546) BP: (137-191)/(79-118) 156/91 mmHg (08/03 0546) SpO2:  [99 %-100 %] 99 % (08/03 0546) Weight:  [75.751 kg (167 lb)] 75.751 kg (167 lb) (08/03 0500)  Weight change:  Filed Weights   10/22/14 0955 10/23/14 0500  Weight: 72.576 kg (160 lb) 75.751 kg (167 lb)    Intake/Output:     Intake/Output this shift:     Physical Exam: General: NAD,   Head: Normocephalic, atraumatic. Moist oral mucosal membranes  Eyes: Anicteric, PERRL  Neck: Supple, trachea midline  Lungs:  Clear to auscultation  Heart: Regular rate and rhythm  Abdomen:  Mild tenderness to palpation, soft  Extremities: no peripheral edema.  Neurologic: Nonfocal, moving all four extremities  Skin: No lesions       Basic Metabolic Panel:  Recent Labs Lab 10/22/14 0959 10/23/14 0537  NA 137 139  K 3.0* 2.8*  CL 99* 105  CO2 25 26  GLUCOSE 107* 103*  BUN 27* 18  CREATININE 2.70* 1.67*  CALCIUM 9.1 8.2*    Liver Function Tests:  Recent Labs Lab 10/22/14 0959 10/23/14 0537  AST 21 20  ALT 19 20  ALKPHOS 62 51  BILITOT 0.7 0.7  PROT 8.3* 6.8  ALBUMIN 3.4* 2.7*    Recent Labs Lab 10/22/14 0959 10/23/14 0537  LIPASE 51 37   No results for input(s): AMMONIA in the last 168 hours.  CBC:  Recent Labs Lab 10/22/14 0959 10/23/14 0537  WBC 11.4* 8.0  NEUTROABS  --  5.4  HGB 10.9* 10.4*  HCT 32.8* 31.2*  MCV 91.0 90.5  PLT 304 257    Cardiac Enzymes: No results for input(s): CKTOTAL, CKMB, CKMBINDEX, TROPONINI in the last 168 hours.  BNP: Invalid input(s): POCBNP  CBG: No results for input(s): GLUCAP in the last 168 hours.  Microbiology: No  results found for this or any previous visit.  Coagulation Studies: No results for input(s): LABPROT, INR in the last 72 hours.  Urinalysis:  Recent Labs  10/22/14 1312  COLORURINE YELLOW*  LABSPEC 1.019  PHURINE 5.0  GLUCOSEU 50*  HGBUR 1+*  BILIRUBINUR NEGATIVE  KETONESUR NEGATIVE  PROTEINUR 30*  NITRITE NEGATIVE  LEUKOCYTESUR NEGATIVE      Imaging: US Abdomen Complete  10/23/2014   EXAM: ULTRASOUND ABDOMEN COMPLETE  COMPARISON:  Abdominal films of October 22, 2014 ; report of a normal abdominal ultrasound dated December 05, 1997.  FINDINGS: Gallbladder: The gallbladder is only partially distended despite a fast for the past 9 hours. No stones are evident. There is gallbladder wall thickening to 3.8 mm. There is a small amount of pericholecystic fluid. There is no positive sonographic Murphy's sign.  Common bile duct: Diameter: 4.2 mm  Liver: The liver exhibits normal echotexture with no focal mass or ductal dilation.  IVC: No abnormality visualized.  Pancreas: Bowel gas limits evaluation of the pancreatic head and tail. The pancreatic body is unremarkable.  Spleen: Size and appearance within normal limits.  Right Kidney: Length: 12.6 cm. The renal cortical echotexture is increased and is approximately equal to that of the adjacent liver. There is no focal mass nor hydronephrosis.  Left Kidney: Length:  11.3 cm. The renal cortical echotexture is mildly increased. There is no focal mass nor hydronephrosis.  Abdominal aorta: The proximal and mid aorta exhibit normal caliber. The distal aorta and bifurcation are obscured by bowel gas.  Other findings: None.  IMPRESSION: 1. Limited gallbladder distention and despite fasting, gallbladder wall thickening and pericholecystic fluid, without evidence of stones. There is no positive sonographic Murphy's sign. The findings may reflect chronic cholecystitis. 2. The liver and visualized portions of the pancreas are normal. 3. Increased renal cortical  echotexture consistent with medical renal disease. There is no evidence of obstruction.   Electronically Signed   By: David  Martinique M.D.   On: 10/23/2014 10:36   Dg Abd 2 Views  10/22/2014   CLINICAL DATA:  54 year old male with pancreatitis 1 month ago. Abdominal pain since. Initial encounter.  EXAM: ABDOMEN - 2 VIEW  COMPARISON:  No comparison abdominal films. Comparison lumbar spine exam 08/16/2013.  FINDINGS: No plain film evidence of bowel obstruction or free intraperitoneal air.  Calcifications to the right of the L2 vertebral body may be related to pancreatitis. Limited for evaluating for possibility of underlying inflammatory process by plain film exam.  Prominent bilateral hip joint degenerative changes.  IMPRESSION: No plain film evidence of bowel obstruction or free intraperitoneal air.  Calcifications to the right of the L2 vertebral body may be related to pancreatitis. Limited for evaluating for possibility of underlying inflammatory process by plain film exam.  Prominent bilateral hip joint degenerative changes.   Electronically Signed   By: Genia Del M.D.   On: 10/22/2014 11:59     Medications:   . 0.9 % NaCl with KCl 20 mEq / L     . cloNIDine  0.1 mg Oral TID  . folic acid  1 mg Oral Daily  . heparin  5,000 Units Subcutaneous 3 times per day  . pantoprazole (PROTONIX) IV  40 mg Intravenous Q12H  . potassium chloride  40 mEq Oral BID   acetaminophen **OR** acetaminophen, alum & mag hydroxide-simeth, hydrALAZINE, morphine injection, ondansetron **OR** ondansetron (ZOFRAN) IV  Assessment/ Plan:  [name] is a 54 y.o. black male with hypertension, cocaine abuse, alcohol abuse, history of alcoholic pancreatitis who was admitted to [facility] on 10/22/2014 for Acute epigastric pain.   1. Acute Renal Failure on chronic kidney disease stage III: with hematuria and proteinuria. Acute renal failure seems to be due to prerenal azotemia via history. Agree with IV fluids. Improving  urine output and renal function with IV fluids.  Monitor volume status and appreciate GI consult, lipase at goal.  Chronic kidney disease is due to hypertensive kidney disease and NSAID.  - renal ultrasound consistent with underlying chronic kidney disease.  - Monitor renal funciton - educated about avoiding nephrotoxic agents including NSAIDs.   2. Hypokalemia: secondary to normal saline - PO potassium replacement ordered - change IV fluids to NS with 51mEq of KCl.   3. Hypertension: not well controlled. Clonidine as outpatient. Hydralazine ordered PRN - increased dose of clonidine. May benefit from a beta blocker, but history of cocaine abuse.  - start amlodipine 10mg  daily today.    LOS: [place], Paul Howard 8/3/201611:23 AM

## 2014-10-23 NOTE — Plan of Care (Addendum)
Problem: Discharge Progression Outcomes Goal: Discharge plan in place and appropriate Outcome: Progressing Individualization of Care Pt prefers to be called Paul Howard Hx of HTN, epigastric abdominal pain and pacreatitis     Plan of Care Progress to Goal:  Pain:  Pt admitted for acute abd pain. He received 1 mg Morphine x 2 this shift.  Pain resolved upon reassessment. Hemodynamically:  Patient has been afebrile and VSS this shift.  BP 152/90 mmHg  Pulse 71  Temp(Src) 98.5 F (36.9 C) (Oral)  Resp 18  Ht 5\' 6"  (1.676 m)  Wt 160 lb (72.576 kg)  BMI 25.84 kg/m2  SpO2 100% Activity:  Patient is up independently and steady on his feet. Complications:  Per patient, he has been unable to void very much in the past two weeks.  This evening he has been able to void successfully several times.  Urine is yellow/straw and is clear without odor.  Will have GI consult and nephrology consult.

## 2014-10-23 NOTE — Progress Notes (Signed)
   10/23/14 1320  Clinical Encounter Type  Visited With Patient  Visit Type Follow-up  Consult/Referral To Chaplain  Follow up visit for Advance Directive.  Patient declined to complete at this time.  Will contact us if he changes his mind.  Offered pastoral presence, support and compassion.  [place] [phone]

## 2014-10-24 ENCOUNTER — Inpatient Hospital Stay: Payer: Self-pay

## 2014-10-24 ENCOUNTER — Encounter: Payer: Self-pay | Admitting: Radiology

## 2014-10-24 LAB — BASIC METABOLIC PANEL
ANION GAP: 9 (ref 5–15)
BUN: 9 mg/dL (ref 6–20)
CO2: 24 mmol/L (ref 22–32)
CREATININE: 1.29 mg/dL — AB (ref 0.61–1.24)
Calcium: 8.2 mg/dL — ABNORMAL LOW (ref 8.9–10.3)
Chloride: 106 mmol/L (ref 101–111)
GFR calc Af Amer: >60 mL/min (ref >60)
GLUCOSE: 92 mg/dL (ref 65–99)
Potassium: 3.8 mmol/L (ref 3.5–5.1)
SODIUM: 139 mmol/L (ref 135–145)

## 2014-10-24 LAB — LIPASE, BLOOD: LIPASE: 36 U/L (ref 22–51)

## 2014-10-24 MED ORDER — HYDRALAZINE HCL 20 MG/ML IJ SOLN: 10.0000 mg | Freq: Four times a day (QID) | INTRAMUSCULAR | Status: DC | PRN

## 2014-10-24 MED ORDER — IOHEXOL 300 MG/ML  SOLN
100.0000 mL | Freq: Once | INTRAMUSCULAR | Status: AC | PRN
  Administered 2014-10-24: 11:00:00 100 mL via INTRAVENOUS

## 2014-10-24 MED ORDER — IOHEXOL 240 MG/ML SOLN: 50.0000 mL | INTRAMUSCULAR | Status: AC

## 2014-10-24 NOTE — Progress Notes (Signed)
[facility] [place] NAME: Paul Howard    MR#:  [id number]  DATE OF BIRTH:  [birth year]  SUBJECTIVE:  Patient  is complaining of midepigastric abdominal pain radiating to his back which is similar to the episode of pink otitis he had in the past. REVIEW OF SYSTEMS:    Review of Systems  Constitutional: Negative for fever and chills.  HENT: Negative for congestion and tinnitus.   Eyes: Negative for blurred vision and double vision.  Respiratory: Negative for cough, shortness of breath and wheezing.   Cardiovascular: Negative for chest pain, orthopnea and PND.  Gastrointestinal: Positive for abdominal pain. Negative for nausea, vomiting and diarrhea.  Genitourinary: Negative for dysuria and hematuria.  Neurological: Negative for dizziness, sensory change and focal weakness.  All other systems reviewed and are negative.   Patient is currently nothing by mouth  DRUG ALLERGIES:  No Known Allergies  VITALS:  Blood pressure 163/91, pulse 63, temperature 98.1 F (36.7 C), temperature source Oral, resp. rate 19, height 5\' 6"  (1.676 m), weight 79.697 kg (175 lb 11.2 oz), SpO2 99 %.  PHYSICAL EXAMINATION:   Physical Exam  Constitutional: He is oriented to person, place, and time and well-developed, well-nourished, and in no distress. No distress.  HENT:  Head: Normocephalic.  Eyes: No scleral icterus.  Neck: Normal range of motion. Neck supple. No JVD present. No tracheal deviation present.  Cardiovascular: Normal rate, regular rhythm and normal heart sounds.  Exam reveals no gallop and no friction rub.   No murmur heard. Pulmonary/Chest: Effort normal and breath sounds normal. No respiratory distress. He has no wheezes. He has no rales. He exhibits no tenderness.  Abdominal: Soft. Bowel sounds are normal. He exhibits no distension and no mass. There is tenderness. There is no rebound and no guarding.  Musculoskeletal: Normal range of  motion. He exhibits no edema.  Neurological: He is alert and oriented to person, place, and time.  Skin: Skin is warm. No rash noted. No erythema.  Psychiatric: Affect and judgment normal.     LABORATORY PANEL:   CBC  Recent Labs Lab 10/23/14 0537  WBC 8.0  HGB 10.4*  HCT 31.2*  PLT 257   ------------------------------------------------------------------------------------------------------------------  Chemistries   Recent Labs Lab 10/23/14 0537 10/24/14 0525  NA 139 139  K 2.8* 3.8  CL 105 106  CO2 26 24  GLUCOSE 103* 92  BUN 18 9  CREATININE 1.67* 1.29*  CALCIUM 8.2* 8.2*  MG 1.8  --   AST 20  --   ALT 20  --   ALKPHOS 51  --   BILITOT 0.7  --    ------------------------------------------------------------------------------------------------------------------  Cardiac Enzymes No results for input(s): TROPONINI in the last 168 hours. ------------------------------------------------------------------------------------------------------------------  RADIOLOGY:  US Abdomen Complete  10/23/2014   EXAM: ULTRASOUND ABDOMEN COMPLETE  COMPARISON:  Abdominal films of October 22, 2014 ; report of a normal abdominal ultrasound dated December 05, 1997.  FINDINGS: Gallbladder: The gallbladder is only partially distended despite a fast for the past 9 hours. No stones are evident. There is gallbladder wall thickening to 3.8 mm. There is a small amount of pericholecystic fluid. There is no positive sonographic Murphy's sign.  Common bile duct: Diameter: 4.2 mm  Liver: The liver exhibits normal echotexture with no focal mass or ductal dilation.  IVC: No abnormality visualized.  Pancreas: Bowel gas limits evaluation of the pancreatic head and tail. The pancreatic body is unremarkable.  Spleen: Size and  appearance within normal limits.  Right Kidney: Length: 12.6 cm. The renal cortical echotexture is increased and is approximately equal to that of the adjacent liver. There is no focal  mass nor hydronephrosis.  Left Kidney: Length: 11.3 cm. The renal cortical echotexture is mildly increased. There is no focal mass nor hydronephrosis.  Abdominal aorta: The proximal and mid aorta exhibit normal caliber. The distal aorta and bifurcation are obscured by bowel gas.  Other findings: None.  IMPRESSION: 1. Limited gallbladder distention and despite fasting, gallbladder wall thickening and pericholecystic fluid, without evidence of stones. There is no positive sonographic Murphy's sign. The findings may reflect chronic cholecystitis. 2. The liver and visualized portions of the pancreas are normal. 3. Increased renal cortical echotexture consistent with medical renal disease. There is no evidence of obstruction.   Electronically Signed   By: David  Martinique M.D.   On: 10/23/2014 10:36     ASSESSMENT AND PLAN:   54 year old male with past medical history of acute pancreatitis, alcohol abuse, hypertension, who presented to the hospital due to abdominal pain, nausea, vomiting and was noted to be in acute renal failure.  #1 acute renal failurerenal failure is likely secondary to dehydration with nausea and vomiting due to his abdominal pain. Creatinine has improved.    #2 Abdominal pain:  Patient underwent abdominal ultrasound which showed evidence of chronic cholecystitis. GI has seen the patient in consultation and recommended a CT scan to better evaluate the abdomen demand. I ordered a CT scan which is pending. His symptoms are consistent with pancreatitis despite a normal lipase level he may have chronic pink otitis. He did drink alcohol about a week ago. Continue supportive care for now for his abdominal pain until further evaluation by GI and CT scan results.   #3 hypokalemia: This is secondary to nausea and vomiting. Potassium is being repleted with daily supplementation.  #4  essential hypertension: Blood pressure is reasonably controlled. Patient will continue with clonidine and Norvasc.  He is also on when necessary hydralazine  #5 history of alcohol abuse: There is no evidence of alcohol withdrawal    Management plans discussed with the patient and he is in agreement  CODE STATUS: Full  DVT Prophylaxis: Heparin subcutaneous  TOTAL TIME TAKING CARE OF THIS PATIENT: 25 minutes.   POSSIBLE D/C IN 1-2 DAYS, DEPENDING ON CLINICAL CONDITION.   Austine Kelsay M.D on 10/24/2014 at 11:30 AM  Between 7am to 6pm - Pager - [phone]  After 6pm go to [email or web address] - password [email or web address] [facility]  [facility]  Office  [phone]  CC: Primary care physician; No primary care provider on file.

## 2014-10-24 NOTE — Plan of Care (Signed)
Problem: Discharge Progression Outcomes Goal: Discharge plan in place and appropriate Outcome: Progressing Pt prefers to be called Paul Howard Hx of HTN, epigastric abdominal pain and pancreatitis    Goal: Other Discharge Outcomes/Goals Outcome: Progressing Pt has been stable his shift.  US of the abdomen performed during the day. Surgical consult pending.  Morphine for abdominal pain has been helpful in managing patient's pain. Voiding large amounts in the urinal. IV fluids at 35ml/hr.  Tolerating clear liquids.

## 2014-10-24 NOTE — Progress Notes (Signed)
[facility]  ROUNDING NOTE   Subjective:   Creatinine improved.  CT pending, was given IV and PO contrast Continue IV NS with 20KCl at 145mL/hr  Objective:  Vital signs in last 24 hours:  Temp:  [98.1 F (36.7 C)-100.1 F (37.8 C)] 98.1 F (36.7 C) (08/04 0448) Pulse Rate:  [63-73] 63 (08/04 0448) Resp:  [19] 19 (08/04 0448) BP: (153-163)/(91-95) 163/91 mmHg (08/04 0448) SpO2:  [99 %-100 %] 99 % (08/04 0448) Weight:  [79.697 kg (175 lb 11.2 oz)] 79.697 kg (175 lb 11.2 oz) (08/04 0500)  Weight change: 7.121 kg (15 lb 11.2 oz) Filed Weights   10/22/14 0955 10/23/14 0500 10/24/14 0500  Weight: 72.576 kg (160 lb) 75.751 kg (167 lb) 79.697 kg (175 lb 11.2 oz)    Intake/Output: I/O last 3 completed shifts: In: 2895 [P.O.:1200; I.V.:1695] Out: 1350 [Urine:1350]   Intake/Output this shift:  Total I/O In: -  Out: 1300 [Urine:1300]  Physical Exam: General: NAD  Head: Normocephalic, atraumatic. Moist oral mucosal membranes  Eyes: Anicteric, PERRL  Neck: Supple, trachea midline  Lungs:  Clear to auscultation  Heart: Regular rate and rhythm  Abdomen:  Mild tenderness to palpation, soft, Right flank tenderness  Extremities: no peripheral edema.  Neurologic: Nonfocal, moving all four extremities  Skin: No lesions       Basic Metabolic Panel:  Recent Labs Lab 10/22/14 0959 10/23/14 0537 10/24/14 0525  NA 137 139 139  K 3.0* 2.8* 3.8  CL 99* 105 106  CO2 25 26 24   GLUCOSE 107* 103* 92  BUN 27* 18 9  CREATININE 2.70* 1.67* 1.29*  CALCIUM 9.1 8.2* 8.2*  MG  --  1.8  --     Liver Function Tests:  Recent Labs Lab 10/22/14 0959 10/23/14 0537  AST 21 20  ALT 19 20  ALKPHOS 62 51  BILITOT 0.7 0.7  PROT 8.3* 6.8  ALBUMIN 3.4* 2.7*    Recent Labs Lab 10/22/14 0959 10/23/14 0537  LIPASE 51 37   No results for input(s): AMMONIA in the last 168 hours.  CBC:  Recent Labs Lab 10/22/14 0959 10/23/14 0537  WBC 11.4* 8.0  NEUTROABS  --   5.4  HGB 10.9* 10.4*  HCT 32.8* 31.2*  MCV 91.0 90.5  PLT 304 257    Cardiac Enzymes: No results for input(s): CKTOTAL, CKMB, CKMBINDEX, TROPONINI in the last 168 hours.  BNP: Invalid input(s): POCBNP  CBG: No results for input(s): GLUCAP in the last 168 hours.  Microbiology: No results found for this or any previous visit.  Coagulation Studies: No results for input(s): LABPROT, INR in the last 72 hours.  Urinalysis:  Recent Labs  10/22/14 1312  COLORURINE YELLOW*  LABSPEC 1.019  PHURINE 5.0  GLUCOSEU 50*  HGBUR 1+*  BILIRUBINUR NEGATIVE  KETONESUR NEGATIVE  PROTEINUR 30*  NITRITE NEGATIVE  LEUKOCYTESUR NEGATIVE      Imaging: US Abdomen Complete  10/23/2014   EXAM: ULTRASOUND ABDOMEN COMPLETE  COMPARISON:  Abdominal films of October 22, 2014 ; report of a normal abdominal ultrasound dated December 05, 1997.  FINDINGS: Gallbladder: The gallbladder is only partially distended despite a fast for the past 9 hours. No stones are evident. There is gallbladder wall thickening to 3.8 mm. There is a small amount of pericholecystic fluid. There is no positive sonographic Murphy's sign.  Common bile duct: Diameter: 4.2 mm  Liver: The liver exhibits normal echotexture with no focal mass or ductal dilation.  IVC: No abnormality visualized.  Pancreas:  Bowel gas limits evaluation of the pancreatic head and tail. The pancreatic body is unremarkable.  Spleen: Size and appearance within normal limits.  Right Kidney: Length: 12.6 cm. The renal cortical echotexture is increased and is approximately equal to that of the adjacent liver. There is no focal mass nor hydronephrosis.  Left Kidney: Length: 11.3 cm. The renal cortical echotexture is mildly increased. There is no focal mass nor hydronephrosis.  Abdominal aorta: The proximal and mid aorta exhibit normal caliber. The distal aorta and bifurcation are obscured by bowel gas.  Other findings: None.  IMPRESSION: 1. Limited gallbladder distention  and despite fasting, gallbladder wall thickening and pericholecystic fluid, without evidence of stones. There is no positive sonographic Murphy's sign. The findings may reflect chronic cholecystitis. 2. The liver and visualized portions of the pancreas are normal. 3. Increased renal cortical echotexture consistent with medical renal disease. There is no evidence of obstruction.   Electronically Signed   By: David  Martinique M.D.   On: 10/23/2014 10:36     Medications:   . 0.9 % NaCl with KCl 20 mEq / L 100 mL/hr at 10/24/14 0855   . amLODipine  10 mg Oral Daily  . cloNIDine  0.1 mg Oral TID  . folic acid  1 mg Oral Daily  . heparin  5,000 Units Subcutaneous 3 times per day  . pantoprazole (PROTONIX) IV  40 mg Intravenous Q12H  . potassium chloride  40 mEq Oral BID   acetaminophen **OR** acetaminophen, alum & mag hydroxide-simeth, hydrALAZINE, hydrALAZINE, morphine injection, ondansetron **OR** ondansetron (ZOFRAN) IV  Assessment/ Plan:  [name] is a 54 y.o. black male with hypertension, cocaine abuse, alcohol abuse, history of alcoholic pancreatitis who was admitted to [facility] on 10/22/2014 for Acute epigastric pain.   1. Acute Renal Failure on chronic kidney disease stage III: with hematuria and proteinuria. Acute renal failure seems to be due to prerenal azotemia via history. Agree with IV fluids. Improving urine output and renal function with IV fluids.  Monitor volume status and appreciate GI consult, lipase at goal.  Chronic kidney disease is due to hypertensive kidney disease and NSAID.  - renal ultrasound consistent with underlying chronic kidney disease.  - Monitor renal funciton - educated about avoiding nephrotoxic agents including NSAIDs.  - Will monitor renal function after IV contrast exposure - Continue IV fluids  2. Hypokalemia: secondary to normal saline. Potassium improved to 3.8  3. Hypertension: not well controlled. Clonidine as outpatient. Hydralazine ordered  PRN - increased dose of clonidine. May benefit from a beta blocker, but history of cocaine abuse.  - started amlodipine 10mg  daily today.    LOS: 2 [place] 8/4/201612:02 PM

## 2014-10-24 NOTE — Plan of Care (Signed)
Problem: Discharge Progression Outcomes Goal: Other Discharge Outcomes/Goals Outcome: Progressing Plan of care progress to goal for: 1. Pain-pt c/o pain, prn meds given with improvement 2. Hemodynamically-             -VSS 3. Complications-no evidence of this shift 4. Diet-pt tolerating diet this shift 5. Activity-pt tolerating diet this shift

## 2014-10-25 LAB — CBC
HCT: 34.2 % — ABNORMAL LOW (ref 40.0–52.0)
HEMOGLOBIN: 11.3 g/dL — AB (ref 13.0–18.0)
MCH: 30.4 pg (ref 26.0–34.0)
MCHC: 33.1 g/dL (ref 32.0–36.0)
MCV: 91.7 fL (ref 80.0–100.0)
PLATELETS: 272 10*3/uL (ref 150–440)
RBC: 3.72 MIL/uL — AB (ref 4.40–5.90)
RDW: 13.5 % (ref 11.5–14.5)
WBC: 7 10*3/uL (ref 3.8–10.6)

## 2014-10-25 LAB — BASIC METABOLIC PANEL
ANION GAP: 8 (ref 5–15)
BUN: 9 mg/dL (ref 6–20)
CO2: 25 mmol/L (ref 22–32)
Calcium: 8.6 mg/dL — ABNORMAL LOW (ref 8.9–10.3)
Chloride: 107 mmol/L (ref 101–111)
Creatinine, Ser: 1.19 mg/dL (ref 0.61–1.24)
Glucose, Bld: 100 mg/dL — ABNORMAL HIGH (ref 65–99)
POTASSIUM: 4.3 mmol/L (ref 3.5–5.1)
Sodium: 140 mmol/L (ref 135–145)

## 2014-10-25 MED ORDER — OCTREOTIDE ACETATE 500 MCG/ML IJ SOLN: 50.0000 ug/h | INTRAMUSCULAR | Status: DC

## 2014-10-25 MED ORDER — OCTREOTIDE LOAD VIA INFUSION: 50.0000 ug | Freq: Once | INTRAVENOUS | Status: DC

## 2014-10-25 MED ORDER — SODIUM CHLORIDE 0.9 % IV SOLN
INTRAVENOUS | Status: DC
  Administered 2014-10-25 (×2): via INTRAVENOUS

## 2014-10-25 NOTE — Progress Notes (Signed)
GI Inpatient Follow-up Note  Patient Identification: Paul Howard is a 54 y.o. male with epigastric pain, ARF,  Recent acute panc now severe pancreatic edema and fluid collections.   Subjective:  Feels better today.  Abd pain improved. No n/v. No f/c. No more dark stools.   Scheduled Inpatient Medications:  . amLODipine  10 mg Oral Daily  . cloNIDine  0.1 mg Oral TID  . folic acid  1 mg Oral Daily  . heparin  5,000 Units Subcutaneous 3 times per day  . pantoprazole (PROTONIX) IV  40 mg Intravenous Q12H    Continuous Inpatient Infusions:   . sodium chloride 100 mL/hr at 10/25/14 1116    PRN Inpatient Medications:  acetaminophen **OR** acetaminophen, alum & mag hydroxide-simeth, hydrALAZINE, hydrALAZINE, morphine injection, ondansetron **OR** ondansetron (ZOFRAN) IV     Physical Examination: BP 146/94 mmHg  Pulse 68  Temp(Src) 98.8 F (37.1 C) (Oral)  Resp 20  Ht 5\' 6"  (1.676 m)  Wt 79.516 kg (175 lb 4.8 oz)  BMI 28.31 kg/m2  SpO2 100% Gen: NAD, alert and oriented x 4 HEENT: PEERLA, EOMI, Neck: supple, no JVD or thyromegaly Chest: CTA bilaterally, no wheezes, crackles, or other adventitious sounds CV: RRR, no m/g/c/r Abd: +diffuse abd pain, worst in epigastricum.  ND, +BS in all four quadrants; no HSM, guarding, ridigity, or rebound tenderness Ext: no edema, well perfused with 2+ pulses, Skin: no rash or lesions noted Lymph: no LAD  Data: Lab Results  Component Value Date   WBC 7.0 10/25/2014   HGB 11.3* 10/25/2014   HCT 34.2* 10/25/2014   MCV 91.7 10/25/2014   PLT 272 10/25/2014    Recent Labs Lab 10/22/14 0959 10/23/14 0537 10/25/14 0338  HGB 10.9* 10.4* 11.3*   Lab Results  Component Value Date   NA 140 10/25/2014   K 4.3 10/25/2014   CL 107 10/25/2014   CO2 25 10/25/2014   BUN 9 10/25/2014   CREATININE 1.19 10/25/2014   Lab Results  Component Value Date   ALT 20 10/23/2014   AST 20 10/23/2014   ALKPHOS 51 10/23/2014   BILITOT 0.7  10/23/2014   No results for input(s): APTT, INR, PTT in the last 168 hours.   CT: Pancreas: There is extensive soft tissue infiltration and ill-defined edema extending ventral and inferior from the pancreatic body and neck with tubular organized collection central to the inflammatory change. All the fluid is likely in continuity, with the largest component 4 cm in length by 13 mm in diameter. There is no internal gas or oral contrast to suggest infection or fistula. Multiple coarse calcifications in the pancreatic head and uncinate region. Adenopathy around the inflammation which may be reactive. No evidence of aneurysm or venous occlusion.  Assessment/Plan: [name] is a 54 y.o. male with pancreatic edema and fluid c/w severe post acute panc changes.  Hgb stable today.  He will likely develop pseudocyst and pancreatic insufficiency.  Currently no indication for surgery, EUS, needle guided drainage, abx etc.  We will need to follow him clinically.   Will need repeat CT in about one month to eval for pseudocyst.  If he develops s/s infection, would worry about necrosis with infection.   Recommendations: - consider EGD if decline in Hgb - cont PPI, watch Hgb - advance diet as tolerated - will need CT in severeal weeks to eval for pseudocyst - watch for evidence of infected peri-panc fluid.  - start panc enzymes if develops diarrhea.  - no  addl recs at this time.   Please call with questions or concerns.  Kayda Allers, Grace Blight, MD

## 2014-10-25 NOTE — Progress Notes (Signed)
[facility]  ROUNDING NOTE   Subjective:   Creatinine 1.19 (1.29)  NS with 20KCl at 141mL/hr  CT showing pancreatitis. Trial of low fat diet did not go well yesterday.   Objective:  Vital signs in last 24 hours:  Temp:  [97.7 F (36.5 C)-99.4 F (37.4 C)] 98 F (36.7 C) (08/05 0851) Pulse Rate:  [69-90] 85 (08/05 0851) Resp:  [16-18] 16 (08/05 0851) BP: (139-181)/(83-98) 160/94 mmHg (08/05 0851) SpO2:  [98 %-100 %] 100 % (08/05 0851) Weight:  [79.516 kg (175 lb 4.8 oz)] 79.516 kg (175 lb 4.8 oz) (08/05 0500)  Weight change: -0.181 kg (-6.4 oz) Filed Weights   10/23/14 0500 10/24/14 0500 10/25/14 0500  Weight: 75.751 kg (167 lb) 79.697 kg (175 lb 11.2 oz) 79.516 kg (175 lb 4.8 oz)    Intake/Output: I/O last 3 completed shifts: In: 4156.7 [I.V.:4156.7] Out: 4350 [Urine:4350]   Intake/Output this shift:     Physical Exam: General: NAD  Head: Normocephalic, atraumatic. Moist oral mucosal membranes  Eyes: Anicteric, PERRL  Neck: Supple, trachea midline  Lungs:  Clear to auscultation  Heart: Regular rate and rhythm  Abdomen:  Mild tenderness to palpation, soft, Right flank tenderness  Extremities: no peripheral edema.  Neurologic: Nonfocal, moving all four extremities  Skin: No lesions       Basic Metabolic Panel:  Recent Labs Lab 10/22/14 0959 10/23/14 0537 10/24/14 0525 10/25/14 0338  NA 137 139 139 140  K 3.0* 2.8* 3.8 4.3  CL 99* 105 106 107  CO2 25 26 24 25   GLUCOSE 107* 103* 92 100*  BUN 27* 18 9 9   CREATININE 2.70* 1.67* 1.29* 1.19  CALCIUM 9.1 8.2* 8.2* 8.6*  MG  --  1.8  --   --     Liver Function Tests:  Recent Labs Lab 10/22/14 0959 10/23/14 0537  AST 21 20  ALT 19 20  ALKPHOS 62 51  BILITOT 0.7 0.7  PROT 8.3* 6.8  ALBUMIN 3.4* 2.7*    Recent Labs Lab 10/22/14 0959 10/23/14 0537 10/24/14 1150  LIPASE 51 37 36   No results for input(s): AMMONIA in the last 168 hours.  CBC:  Recent Labs Lab 10/22/14 0959  10/23/14 0537 10/25/14 0338  WBC 11.4* 8.0 7.0  NEUTROABS  --  5.4  --   HGB 10.9* 10.4* 11.3*  HCT 32.8* 31.2* 34.2*  MCV 91.0 90.5 91.7  PLT 304 257 272    Cardiac Enzymes: No results for input(s): CKTOTAL, CKMB, CKMBINDEX, TROPONINI in the last 168 hours.  BNP: Invalid input(s): POCBNP  CBG: No results for input(s): GLUCAP in the last 168 hours.  Microbiology: No results found for this or any previous visit.  Coagulation Studies: No results for input(s): LABPROT, INR in the last 72 hours.  Urinalysis:  Recent Labs  10/22/14 1312  COLORURINE YELLOW*  LABSPEC 1.019  PHURINE 5.0  GLUCOSEU 50*  HGBUR 1+*  BILIRUBINUR NEGATIVE  KETONESUR NEGATIVE  PROTEINUR 30*  NITRITE NEGATIVE  LEUKOCYTESUR NEGATIVE      Imaging: US Abdomen Complete  10/23/2014   EXAM: ULTRASOUND ABDOMEN COMPLETE  COMPARISON:  Abdominal films of October 22, 2014 ; report of a normal abdominal ultrasound dated December 05, 1997.  FINDINGS: Gallbladder: The gallbladder is only partially distended despite a fast for the past 9 hours. No stones are evident. There is gallbladder wall thickening to 3.8 mm. There is a small amount of pericholecystic fluid. There is no positive sonographic Murphy's sign.  Common bile  duct: Diameter: 4.2 mm  Liver: The liver exhibits normal echotexture with no focal mass or ductal dilation.  IVC: No abnormality visualized.  Pancreas: Bowel gas limits evaluation of the pancreatic head and tail. The pancreatic body is unremarkable.  Spleen: Size and appearance within normal limits.  Right Kidney: Length: 12.6 cm. The renal cortical echotexture is increased and is approximately equal to that of the adjacent liver. There is no focal mass nor hydronephrosis.  Left Kidney: Length: 11.3 cm. The renal cortical echotexture is mildly increased. There is no focal mass nor hydronephrosis.  Abdominal aorta: The proximal and mid aorta exhibit normal caliber. The distal aorta and bifurcation are  obscured by bowel gas.  Other findings: None.  IMPRESSION: 1. Limited gallbladder distention and despite fasting, gallbladder wall thickening and pericholecystic fluid, without evidence of stones. There is no positive sonographic Murphy's sign. The findings may reflect chronic cholecystitis. 2. The liver and visualized portions of the pancreas are normal. 3. Increased renal cortical echotexture consistent with medical renal disease. There is no evidence of obstruction.   Electronically Signed   By: David  Martinique M.D.   On: 10/23/2014 10:36   Ct Abdomen Pelvis W Contrast  10/24/2014   CLINICAL DATA:  Abdominal pain due to pancreatitis  EXAM: CT ABDOMEN AND PELVIS WITH CONTRAST  TECHNIQUE: Multidetector CT imaging of the abdomen and pelvis was performed using the standard protocol following bolus administration of intravenous contrast.  CONTRAST:  164mL OMNIPAQUE IOHEXOL 300 MG/ML  SOLN  COMPARISON:  None.  FINDINGS: BODY WALL: Right inguinal hernia containing fat and ascitic fluid.  LOWER CHEST: Trace bilateral pleural effusion with mild atelectasis.  ABDOMEN/PELVIS:  Liver: No focal abnormality.  Biliary: The distal common bile duct is difficult to visualize, likely due to acute edema, but neighboring calculi are likely pancreatic parenchymal rather than choledocholithiasis as there is no common bile duct enlargement or elevated bilirubin.  Pancreas: There is extensive soft tissue infiltration and ill-defined edema extending ventral and inferior from the pancreatic body and neck with tubular organized collection central to the inflammatory change. All the fluid is likely in continuity, with the largest component 4 cm in length by 13 mm in diameter. There is no internal gas or oral contrast to suggest infection or fistula. Multiple coarse calcifications in the pancreatic head and uncinate region. Adenopathy around the inflammation which may be reactive. No evidence of aneurysm or venous occlusion.  Spleen:  Unremarkable.  Adrenals: Unremarkable.  Kidneys and ureters: No hydronephrosis or stone.  Bladder: Unremarkable.  Reproductive: No pathologic findings.  Bowel: Secondary inflammation of the proximal transverse colon neighboring the peripancreatic edema. No appendicitis.  Peritoneum: Small volume Ascitic fluid in the lower pelvic recesses and in the right inguinal hernia.  Vascular: No acute abnormality.  OSSEOUS: L4-5 and L5-S1 degenerative disc disease with fusion at L5-S1.  IMPRESSION: 1. Active pancreatitis with organized peripancreatic collection measuring 40 x 13 mm. 2. Pancreatic calcification compatible with chronic/previous pancreatitis. 3. Right inguinal hernia containing fat and ascites.   Electronically Signed   By: Monte Fantasia M.D.   On: 10/24/2014 13:11     Medications:   . 0.9 % NaCl with KCl 20 mEq / L 100 mL/hr at 10/25/14 0452   . amLODipine  10 mg Oral Daily  . cloNIDine  0.1 mg Oral TID  . folic acid  1 mg Oral Daily  . heparin  5,000 Units Subcutaneous 3 times per day  . pantoprazole (PROTONIX) IV  40 mg Intravenous  Q12H  . potassium chloride  40 mEq Oral BID   acetaminophen **OR** acetaminophen, alum & mag hydroxide-simeth, hydrALAZINE, hydrALAZINE, morphine injection, ondansetron **OR** ondansetron (ZOFRAN) IV  Assessment/ Plan:  [name] is a 54 y.o. black male with hypertension, cocaine abuse, alcohol abuse, history of alcoholic pancreatitis who was admitted to [facility] on 10/22/2014 for Acute epigastric pain.   1. Acute Renal Failure on chronic kidney disease stage III: with hematuria and proteinuria. Acute renal failure seems to be due to prerenal azotemia via history. Agree with IV fluids. Improving urine output and renal function with IV fluids.  Monitor volume status and appreciate GI consult, lipase at goal.  Chronic kidney disease is due to hypertensive kidney disease and NSAID.  - renal ultrasound consistent with underlying chronic kidney disease.  -  Monitor renal funciton - educated about avoiding nephrotoxic agents including NSAIDs.  - Will monitor renal function after IV contrast exposure - Continue IV fluids: reduce to 40mL/hr.   2. Hypokalemia: secondary to normal saline. Potassium improved with oral and IV replacement  3. Hypertension: not well controlled. Clonidine as outpatient. Hydralazine ordered PRN - increased dose of clonidine. May benefit from a beta blocker, but history of cocaine abuse.  - started amlodipine 10mg  on this admission  Will monitor labs over the weekend. Please call with questions.    LOS: 3 Paul Howard 8/5/20169:37 AM

## 2014-10-25 NOTE — Plan of Care (Signed)
Problem: Discharge Progression Outcomes Goal: Other Discharge Outcomes/Goals Outcome: Progressing Plan of Care Progress to Goal:   Pt still has abdominal pain which is currently controlled with morphine. Pt report nausea once during shift. Pt BP has improved. No other signs of distress noted. Will continue to monitor.

## 2014-10-25 NOTE — Progress Notes (Signed)
Dr. Rayann Heman called and gave nurse telephone order to advance patients diet to low fat diet due to his stable hemoglobin.

## 2014-10-25 NOTE — Progress Notes (Signed)
[name] [place] NAME: Paul Howard    MR#:  [id number]  DATE OF BIRTH:  Feb 23, 1961  SUBJECTIVE:  Patient continues to have midepigastric abdominal pain. Patient also states that he had dark colored stool this morning. Patient is also with some nausea. REVIEW OF SYSTEMS:    Review of Systems  Constitutional: Negative for fever and chills.  HENT: Negative for congestion and tinnitus.   Eyes: Negative for blurred vision and double vision.  Respiratory: Negative for cough, shortness of breath and wheezing.   Cardiovascular: Negative for chest pain, orthopnea and PND.  Gastrointestinal: Positive for nausea, abdominal pain and melena. Negative for vomiting and diarrhea.  Genitourinary: Negative for dysuria and hematuria.  Neurological: Negative for dizziness, sensory change and focal weakness.  All other systems reviewed and are negative.   Patient is currently nothing by mouth  DRUG ALLERGIES:  No Known Allergies  VITALS:  Blood pressure 160/94, pulse 85, temperature 98 F (36.7 C), temperature source Axillary, resp. rate 16, height 5\' 6"  (1.676 m), weight 79.516 kg (175 lb 4.8 oz), SpO2 100 %.  PHYSICAL EXAMINATION:   Physical Exam  Constitutional: He is oriented to person, place, and time and well-developed, well-nourished, and in no distress. No distress.  HENT:  Head: Normocephalic.  Eyes: No scleral icterus.  Neck: Normal range of motion. Neck supple. No JVD present. No tracheal deviation present.  Cardiovascular: Normal rate, regular rhythm and normal heart sounds.  Exam reveals no gallop and no friction rub.   No murmur heard. Pulmonary/Chest: Effort normal and breath sounds normal. No respiratory distress. He has no wheezes. He has no rales. He exhibits no tenderness.  Abdominal: Soft. Bowel sounds are normal. He exhibits no distension and no mass. There is tenderness. There is no rebound and no guarding.   Musculoskeletal: Normal range of motion. He exhibits no edema.  Neurological: He is alert and oriented to person, place, and time.  Skin: Skin is warm. No rash noted. No erythema.  Psychiatric: Affect and judgment normal.     LABORATORY PANEL:   CBC  Recent Labs Lab 10/25/14 0338  WBC 7.0  HGB 11.3*  HCT 34.2*  PLT 272   ------------------------------------------------------------------------------------------------------------------  Chemistries   Recent Labs Lab 10/23/14 0537  10/25/14 0338  NA 139  < > 140  K 2.8*  < > 4.3  CL 105  < > 107  CO2 26  < > 25  GLUCOSE 103*  < > 100*  BUN 18  < > 9  CREATININE 1.67*  < > 1.19  CALCIUM 8.2*  < > 8.6*  MG 1.8  --   --   AST 20  --   --   ALT 20  --   --   ALKPHOS 51  --   --   BILITOT 0.7  --   --   < > = values in this interval not displayed. ------------------------------------------------------------------------------------------------------------------  Cardiac Enzymes No results for input(s): TROPONINI in the last 168 hours. ------------------------------------------------------------------------------------------------------------------  RADIOLOGY:  Ct Abdomen Pelvis W Contrast  10/24/2014   IMPRESSION: 1. Active pancreatitis with organized peripancreatic collection measuring 40 x 13 mm. 2. Pancreatic calcification compatible with chronic/previous pancreatitis. 3. Right inguinal hernia containing fat and ascites.   Electronically Signed   By: Monte Fantasia M.D.   On: 10/24/2014 13:11     ASSESSMENT AND PLAN:   54 year old male with past medical history of acute pancreatitis, alcohol abuse, hypertension,  who presented to the hospital due to abdominal pain, nausea, vomiting and was noted to be in acute renal failure.  1 acute renal failurerenal failure is due to prerenal azotemia in the setting of dehydration with nausea and vomiting due to his abdominal pain. Creatinine has improved.    2 Acute  pancreatitis: This is the etiology of his abdominal pain. His symptoms are classic for acute pancreatitis. He also has a large organized peripancreatic collection. GI is following the patient in consultation. In my discussion with GI this morning we will continue with supportive care and treat his acute pancreatitis with IV fluids, antiemetics and pain medications. Patient is currently nothing by mouth until his abdominal pain subsides.   3 hypokalemia: This is secondary to nausea and vomiting. Potassium is being repleted with daily supplementation.  4  essential hypertension: Blood pressure is reasonably controlled. Patient will continue with clonidine and Norvasc. He is also on when necessary hydralazine  5 history of alcohol abuse: There is no evidence of alcohol withdrawal 6. Melana: I spoke with GI regarding his dark-colored stool. His hemoglobin has remained stable. As per my conversation with GI, Dr. Rayann Heman thought this may be due to his pancreatitis and fluid collection. I will continue to monitor hemoglobin. His vitals are stable at this time. No plan for invasive workup at this time.   Management plans discussed with the patient and he is in agreement Case discussed with Dr. Rayann Heman CODE STATUS: Full  DVT Prophylaxis: Heparin subcutaneous  TOTAL TIME TAKING CARE OF THIS PATIENT: 25 minutes.   POSSIBLE D/C IN 2-3 DAYS, DEPENDING ON CLINICAL CONDITION.   Anaka Beazer M.D on 10/25/2014 at 10:55 AM  Between 7am to 6pm - Pager - [phone]  After 6pm go to [email or web address] - password [email or web address]  [facility]  Office  [phone]  CC: Primary care physician; No primary care provider on file.

## 2014-10-26 LAB — CBC
HEMATOCRIT: 34 % — AB (ref 40.0–52.0)
HEMOGLOBIN: 11.8 g/dL — AB (ref 13.0–18.0)
MCH: 31.5 pg (ref 26.0–34.0)
MCHC: 34.8 g/dL (ref 32.0–36.0)
MCV: 90.5 fL (ref 80.0–100.0)
Platelets: 274 10*3/uL (ref 150–440)
RBC: 3.75 MIL/uL — ABNORMAL LOW (ref 4.40–5.90)
RDW: 13.4 % (ref 11.5–14.5)
WBC: 6.5 10*3/uL (ref 3.8–10.6)

## 2014-10-26 LAB — BASIC METABOLIC PANEL
ANION GAP: 9 (ref 5–15)
BUN: 10 mg/dL (ref 6–20)
CO2: 25 mmol/L (ref 22–32)
Calcium: 8.5 mg/dL — ABNORMAL LOW (ref 8.9–10.3)
Chloride: 104 mmol/L (ref 101–111)
Creatinine, Ser: 1.08 mg/dL (ref 0.61–1.24)
GFR calc non Af Amer: >60 mL/min (ref >60)
GLUCOSE: 92 mg/dL (ref 65–99)
POTASSIUM: 3.6 mmol/L (ref 3.5–5.1)
SODIUM: 138 mmol/L (ref 135–145)

## 2014-10-26 MED ORDER — PANTOPRAZOLE SODIUM 40 MG PO TBEC: 40.0000 mg | DELAYED_RELEASE_TABLET | Freq: Every day | ORAL | Status: DC

## 2014-10-26 MED ORDER — HYDRALAZINE HCL 25 MG PO TABS
25.0000 mg | ORAL_TABLET | Freq: Three times a day (TID) | ORAL | Status: DC
  Administered 2014-10-26: 25 mg via ORAL
  Filled 2014-10-26: qty 1

## 2014-10-26 MED ORDER — AMLODIPINE BESYLATE 10 MG PO TABS
10.0000 mg | ORAL_TABLET | Freq: Every day | ORAL | Status: DC
Start: 2014-10-26 — End: 2017-01-19

## 2014-10-26 MED ORDER — HYDRALAZINE HCL 25 MG PO TABS: 25.0000 mg | ORAL_TABLET | Freq: Two times a day (BID) | ORAL | Status: DC

## 2014-10-26 NOTE — Discharge Summary (Signed)
[facility] [place] NAME: Paul Howard    MR#:  [id number]  DATE OF BIRTH:  08/14/1960  DATE OF ADMISSION:  10/22/2014 ADMITTING PHYSICIAN: Juluis Mire, MD  DATE OF DISCHARGE: 10/26/2014  PRIMARY CARE PHYSICIAN: No primary care provider on file.    ADMISSION DIAGNOSIS:  Abdominal pain [R10.9] Acute renal failure, unspecified acute renal failure type [N17.9]  DISCHARGE DIAGNOSIS:  Principal Problem:   Acute epigastric pain Active Problems:   Pancreatitis, alcoholic, acute   AKI (acute kidney injury)   HTN (hypertension)   SECONDARY DIAGNOSIS:   Past Medical History  Diagnosis Date  . Hypertension   . Pancreatitis     HOSPITAL COURSE:  54 year old male past medical history of acute pancreatitis, EtOH abuse, essential hypertension who presented to the hospital with mid epigastric abdominal pain, nausea and vomiting. For further details please further H&P.  1. Acute renal failure: This renal failure is due to prerenal azotemia in the setting of dehydration with nausea and vomiting due to his abdominal pain as well as pancreatitis. Creatinine is improving with IV fluids. Nephrology was consulted and consult was appreciated.  2. Acute pancreatitis: Patient presented with midepigastric abdominal pain, nausea and vomiting. The pain radiated to his back. Patient's symptoms were consistent with pancreatitis despite a normal lipase level. Patient underwent a CT scan of the abdomen which did show acute pancreatitis. He also has severe pancreatic edema and fluid consistent with severe postacute pancreatic changes. His hemoglobin remained stable. As per GI consultation and they feel that he will likely develop pseudocyst and pancreatic insufficiency. Currently no indication for surgery, EUS or needle guided drainage. Patient will need repeat CT scan in about 1 month to evaluate for cirrhosis. Patient was treated for pancreatitis with  nothing by mouth status and his diet was changed to a soft diet as he was not complaining of abdominal pain. He tolerated his diet well. He will follow-up with GI in 2 weeks and will need a CT scan in 4 weeks.   3. Hypokalemia: This is secondary to nausea and vomiting. Potassium was repleted. Potassium is within normal limits at discharge.  4. Essential hypertension: Patient's blood pressure was fluctuating. Patient is currently in clonidine and Norvasc. I also added hydralazine to his medication regimen.  5. History of EtOH abuse: Patient had no signs or symptoms of alcohol withdrawal. I reiterated that the patient must not drink alcohol due to problem #2. He voices his understanding.  6. Dark-colored stool I spoke with GI regarding this. His hemoglobin is remaining stable at this time GI did not recommend any further workup.  DISCHARGE CONDITIONS AND DIET:  Patient is being discharged in stable condition on a soft diet, heart healthy  CONSULTS OBTAINED:  Treatment Team:  Josefine Class, MD Lavonia Dana, MD  DRUG ALLERGIES:  No Known Allergies  DISCHARGE MEDICATIONS:   Current Discharge Medication List    START taking these medications   Details  amLODipine (NORVASC) 10 MG tablet Take 1 tablet (10 mg total) by mouth daily. Qty: 30 tablet, Refills: 0    hydrALAZINE (APRESOLINE) 25 MG tablet Take 1 tablet (25 mg total) by mouth 2 (two) times daily. Qty: 60 tablet, Refills: 0    pantoprazole (PROTONIX) 40 MG tablet Take 1 tablet (40 mg total) by mouth daily. Qty: 30 tablet, Refills: 0      CONTINUE these medications which have NOT CHANGED   Details  cloNIDine (CATAPRES) 0.1 MG  tablet Take 1 tablet (0.1 mg total) by mouth 2 (two) times daily. Qty: 60 tablet, Refills: 11    folic acid (FOLVITE) 1 MG tablet Take 1 tablet (1 mg total) by mouth daily. Qty: 30 tablet, Refills: 0              Today   CHIEF COMPLAINT:  Patient denies any abdominal pain. Patient  tolerated a soft diet without any nausea vomiting or abdominal pain.   VITAL SIGNS:  Blood pressure 164/100, pulse 80, temperature 98 F (36.7 C), temperature source Oral, resp. rate 24, height 5\' 6"  (1.676 m), weight 78.971 kg (174 lb 1.6 oz), SpO2 99 %.   REVIEW OF SYSTEMS:  Review of Systems  Constitutional: Negative for fever, chills and malaise/fatigue.  HENT: Negative for sore throat.   Eyes: Negative for blurred vision.  Respiratory: Negative for cough, hemoptysis, shortness of breath and wheezing.   Cardiovascular: Negative for chest pain, palpitations and leg swelling.  Gastrointestinal: Negative for nausea, vomiting, abdominal pain, diarrhea and blood in stool.  Genitourinary: Negative for dysuria.  Musculoskeletal: Negative for back pain.  Neurological: Negative for dizziness, tremors and headaches.  Endo/Heme/Allergies: Does not bruise/bleed easily.     PHYSICAL EXAMINATION:  GENERAL:  54 y.o.-year-old patient lying in the bed with no acute distress.  NECK:  Supple, no jugular venous distention. No thyroid enlargement, no tenderness.  LUNGS: Normal breath sounds bilaterally, no wheezing, rales,rhonchi  No use of accessory muscles of respiration.  CARDIOVASCULAR: S1, S2 normal. No murmurs, rubs, or gallops.  ABDOMEN: Soft, non-tender, non-distended. Bowel sounds present. No organomegaly or mass.  EXTREMITIES: No pedal edema, cyanosis, or clubbing.  PSYCHIATRIC: The patient is alert and oriented x 3.  SKIN: No obvious rash, lesion, or ulcer.   DATA REVIEW:   CBC  Recent Labs Lab 10/26/14 0502  WBC 6.5  HGB 11.8*  HCT 34.0*  PLT 274    Chemistries   Recent Labs Lab 10/23/14 0537  10/26/14 0502  NA 139  < > 138  K 2.8*  < > 3.6  CL 105  < > 104  CO2 26  < > 25  GLUCOSE 103*  < > 92  BUN 18  < > 10  CREATININE 1.67*  < > 1.08  CALCIUM 8.2*  < > 8.5*  MG 1.8  --   --   AST 20  --   --   ALT 20  --   --   ALKPHOS 51  --   --   BILITOT 0.7  --   --    < > = values in this interval not displayed.  Cardiac Enzymes No results for input(s): TROPONINI in the last 168 hours.  Microbiology Results  @MICRORSLT48 @  RADIOLOGY:  Ct Abdomen Pelvis W Contrast  10/24/2014   CLINICAL DATA:  Abdominal pain due to pancreatitis  EXAM: CT ABDOMEN AND PELVIS WITH CONTRAST  TECHNIQUE: Multidetector CT imaging of the abdomen and pelvis was performed using the standard protocol following bolus administration of intravenous contrast.  CONTRAST:  174mL OMNIPAQUE IOHEXOL 300 MG/ML  SOLN  COMPARISON:  None.  FINDINGS: BODY WALL: Right inguinal hernia containing fat and ascitic fluid.  LOWER CHEST: Trace bilateral pleural effusion with mild atelectasis.  ABDOMEN/PELVIS:  Liver: No focal abnormality.  Biliary: The distal common bile duct is difficult to visualize, likely due to acute edema, but neighboring calculi are likely pancreatic parenchymal rather than choledocholithiasis as there is no common bile duct enlargement or elevated  bilirubin.  Pancreas: There is extensive soft tissue infiltration and ill-defined edema extending ventral and inferior from the pancreatic body and neck with tubular organized collection central to the inflammatory change. All the fluid is likely in continuity, with the largest component 4 cm in length by 13 mm in diameter. There is no internal gas or oral contrast to suggest infection or fistula. Multiple coarse calcifications in the pancreatic head and uncinate region. Adenopathy around the inflammation which may be reactive. No evidence of aneurysm or venous occlusion.  Spleen: Unremarkable.  Adrenals: Unremarkable.  Kidneys and ureters: No hydronephrosis or stone.  Bladder: Unremarkable.  Reproductive: No pathologic findings.  Bowel: Secondary inflammation of the proximal transverse colon neighboring the peripancreatic edema. No appendicitis.  Peritoneum: Small volume Ascitic fluid in the lower pelvic recesses and in the right inguinal hernia.   Vascular: No acute abnormality.  OSSEOUS: L4-5 and L5-S1 degenerative disc disease with fusion at L5-S1.  IMPRESSION: 1. Active pancreatitis with organized peripancreatic collection measuring 40 x 13 mm. 2. Pancreatic calcification compatible with chronic/previous pancreatitis. 3. Right inguinal hernia containing fat and ascites.   Electronically Signed   By: Monte Fantasia M.D.   On: 10/24/2014 13:11      Management plans discussed with the patient and he is in agreement. Stable for discharge home  Patient should follow up with GI in 2 weeks  CODE STATUS:     Code Status Orders        Start     Ordered   10/22/14 1339  Full code   Continuous     10/22/14 1338      TOTAL TIME TAKING CARE OF THIS PATIENT: 35 minutes.    Aniello Christopoulos M.D on 10/26/2014 at 10:40 AM  Between 7am to 6pm - Pager - [phone] After 6pm go to [email or web address] - password [email or web address] [facility]  [facility]  Office  [phone]  CC: Primary care physician; No primary care provider on file.

## 2014-10-26 NOTE — Plan of Care (Signed)
Problem: Discharge Progression Outcomes Goal: Other Discharge Outcomes/Goals Outcome: Progressing Plan of Care Progress to Goal:    Pt still report abdominal pain. Pt pain was relieved with IV morphine. No other signs of distress noted.

## 2014-11-12 ENCOUNTER — Ambulatory Visit: Payer: Self-pay

## 2014-11-26 ENCOUNTER — Ambulatory Visit: Payer: Self-pay

## 2014-12-26 ENCOUNTER — Other Ambulatory Visit: Payer: Self-pay | Admitting: Urology

## 2014-12-26 DIAGNOSIS — I701 Atherosclerosis of renal artery: Secondary | ICD-10-CM

## 2014-12-26 DIAGNOSIS — K7460 Unspecified cirrhosis of liver: Secondary | ICD-10-CM

## 2014-12-31 ENCOUNTER — Ambulatory Visit: Admission: RE | Admit: 2014-12-31 | Payer: Self-pay | Source: Ambulatory Visit

## 2015-01-02 ENCOUNTER — Ambulatory Visit: Payer: Self-pay

## 2015-01-07 ENCOUNTER — Ambulatory Visit
Admission: RE | Admit: 2015-01-07 | Discharge: 2015-01-07 | Disposition: A | Discharge status: Home / Self Care | Payer: Self-pay | Source: Ambulatory Visit | Attending: Urology | Admitting: Urology

## 2015-01-07 ENCOUNTER — Telehealth: Payer: Self-pay

## 2015-01-07 DIAGNOSIS — I701 Atherosclerosis of renal artery: Secondary | ICD-10-CM

## 2015-01-07 DIAGNOSIS — K859 Acute pancreatitis without necrosis or infection, unspecified: Secondary | ICD-10-CM | POA: Insufficient documentation

## 2015-01-07 DIAGNOSIS — I10 Essential (primary) hypertension: Secondary | ICD-10-CM | POA: Insufficient documentation

## 2015-01-07 DIAGNOSIS — K4090 Unilateral inguinal hernia, without obstruction or gangrene, not specified as recurrent: Secondary | ICD-10-CM | POA: Insufficient documentation

## 2015-01-07 HISTORY — DX: Unspecified asthma, uncomplicated: J45.909

## 2015-01-07 LAB — POCT I-STAT CREATININE: CREATININE: 1.9 mg/dL — AB (ref 0.61–1.24)

## 2015-01-07 MED ORDER — IOHEXOL 350 MG/ML SOLN
80.0000 mL | Freq: Once | INTRAVENOUS | Status: AC | PRN
  Administered 2015-01-07: 80 mL via INTRAVENOUS

## 2015-01-07 NOTE — Telephone Encounter (Signed)
Anderson Malta from the imaging center called stating the CT angio and CT with/without contrast was a lot of imaging. Nurse made Anderson Malta aware [facility] and [facility] does not have any affiliation with the clinic. Nurse read off orders Larene Beach [facility] and made her aware to do what was on the orders. Anderson Malta requested pt to f/u with [facility]. Nurse made Anderson Malta aware that pt was not a [facility] pt therefore [facility]. Anderson Malta voiced understanding.

## 2015-01-09 ENCOUNTER — Ambulatory Visit: Admission: RE | Admit: 2015-01-09 | Payer: Self-pay | Source: Ambulatory Visit

## 2016-10-08 DIAGNOSIS — E876 Hypokalemia: Secondary | ICD-10-CM | POA: Insufficient documentation

## 2016-10-08 DIAGNOSIS — Z79899 Other long term (current) drug therapy: Secondary | ICD-10-CM | POA: Insufficient documentation

## 2016-10-08 DIAGNOSIS — I10 Essential (primary) hypertension: Secondary | ICD-10-CM | POA: Insufficient documentation

## 2016-10-08 DIAGNOSIS — K4090 Unilateral inguinal hernia, without obstruction or gangrene, not specified as recurrent: Secondary | ICD-10-CM | POA: Insufficient documentation

## 2016-10-08 DIAGNOSIS — J45909 Unspecified asthma, uncomplicated: Secondary | ICD-10-CM | POA: Insufficient documentation

## 2016-10-08 LAB — COMPREHENSIVE METABOLIC PANEL
ALBUMIN: 4 g/dL (ref 3.5–5.0)
ALT: 18 U/L (ref 17–63)
AST: 22 U/L (ref 15–41)
Alkaline Phosphatase: 61 U/L (ref 38–126)
Anion gap: 8 (ref 5–15)
BUN: 21 mg/dL — ABNORMAL HIGH (ref 6–20)
CHLORIDE: 104 mmol/L (ref 101–111)
CO2: 25 mmol/L (ref 22–32)
CREATININE: 1.19 mg/dL (ref 0.61–1.24)
Calcium: 9.2 mg/dL (ref 8.9–10.3)
GFR calc Af Amer: >60 mL/min (ref >60)
GFR calc non Af Amer: >60 mL/min (ref >60)
GLUCOSE: 94 mg/dL (ref 65–99)
POTASSIUM: 2.7 mmol/L — AB (ref 3.5–5.1)
Sodium: 137 mmol/L (ref 135–145)
Total Bilirubin: 0.8 mg/dL (ref 0.3–1.2)
Total Protein: 7.7 g/dL (ref 6.5–8.1)

## 2016-10-08 LAB — LIPASE, BLOOD: LIPASE: 24 U/L (ref 11–51)

## 2016-10-08 LAB — CBC
HEMATOCRIT: 39.2 % — AB (ref 40.0–52.0)
Hemoglobin: 13.3 g/dL (ref 13.0–18.0)
MCH: 30.4 pg (ref 26.0–34.0)
MCHC: 33.9 g/dL (ref 32.0–36.0)
MCV: 89.7 fL (ref 80.0–100.0)
PLATELETS: 201 10*3/uL (ref 150–440)
RBC: 4.37 MIL/uL — ABNORMAL LOW (ref 4.40–5.90)
RDW: 13.2 % (ref 11.5–14.5)
WBC: 8.8 10*3/uL (ref 3.8–10.6)

## 2016-10-08 LAB — TROPONIN I: Troponin I: <0.03 ng/mL (ref <0.03)

## 2016-10-08 MED ORDER — POTASSIUM CHLORIDE CRYS ER 20 MEQ PO TBCR
40.0000 meq | EXTENDED_RELEASE_TABLET | Freq: Once | ORAL | Status: AC
  Administered 2016-10-08: 40 meq via ORAL
  Filled 2016-10-08: qty 2

## 2016-10-08 NOTE — ED Triage Notes (Signed)
Pt presents via EMS c/o abd pain extending into back. Reports hx pancreatitis. Denies N/V.

## 2016-10-09 ENCOUNTER — Emergency Department: Payer: Self-pay

## 2016-10-09 ENCOUNTER — Emergency Department
Admission: EM | Admit: 2016-10-09 | Discharge: 2016-10-09 | Disposition: A | Discharge status: Home / Self Care | Payer: Self-pay | Attending: Emergency Medicine | Admitting: Emergency Medicine

## 2016-10-09 DIAGNOSIS — K4090 Unilateral inguinal hernia, without obstruction or gangrene, not specified as recurrent: Secondary | ICD-10-CM

## 2016-10-09 DIAGNOSIS — E876 Hypokalemia: Secondary | ICD-10-CM

## 2016-10-09 LAB — URINALYSIS, COMPLETE (UACMP) WITH MICROSCOPIC
BACTERIA UA: NONE SEEN
Bilirubin Urine: NEGATIVE
Glucose, UA: 50 mg/dL — AB
Hgb urine dipstick: NEGATIVE
Ketones, ur: NEGATIVE mg/dL
Leukocytes, UA: NEGATIVE
NITRITE: NEGATIVE
PH: 7 (ref 5.0–8.0)
Protein, ur: NEGATIVE mg/dL
Specific Gravity, Urine: 1.025 (ref 1.005–1.030)
Squamous Epithelial / LPF: NONE SEEN
WBC, UA: NONE SEEN WBC/hpf (ref 0–5)

## 2016-10-09 LAB — URINE DRUG SCREEN, QUALITATIVE (ARMC ONLY)
Amphetamines, Ur Screen: NOT DETECTED
Barbiturates, Ur Screen: NOT DETECTED
Benzodiazepine, Ur Scrn: POSITIVE — AB
Cannabinoid 50 Ng, Ur ~~LOC~~: NOT DETECTED
Cocaine Metabolite,Ur ~~LOC~~: POSITIVE — AB
MDMA (Ecstasy)Ur Screen: NOT DETECTED
METHADONE SCREEN, URINE: NOT DETECTED
Opiate, Ur Screen: NOT DETECTED
Phencyclidine (PCP) Ur S: NOT DETECTED
Tricyclic, Ur Screen: NOT DETECTED

## 2016-10-09 MED ORDER — ONDANSETRON HCL 4 MG/2ML IJ SOLN
4.0000 mg | Freq: Once | INTRAMUSCULAR | Status: AC
  Administered 2016-10-09: 4 mg via INTRAVENOUS
  Filled 2016-10-09: qty 2

## 2016-10-09 MED ORDER — POTASSIUM CHLORIDE 20 MEQ PO PACK
40.0000 meq | PACK | Freq: Two times a day (BID) | ORAL | Status: DC
  Administered 2016-10-09: 40 meq via ORAL

## 2016-10-09 MED ORDER — KETOROLAC TROMETHAMINE 30 MG/ML IJ SOLN
30.0000 mg | Freq: Once | INTRAMUSCULAR | Status: AC
  Administered 2016-10-09: 30 mg via INTRAVENOUS
  Filled 2016-10-09: qty 1

## 2016-10-09 MED ORDER — POTASSIUM CHLORIDE 20 MEQ PO PACK
PACK | ORAL | Status: AC
  Filled 2016-10-09: qty 2

## 2016-10-09 MED ORDER — CLONIDINE HCL 0.1 MG PO TABS
0.1000 mg | ORAL_TABLET | Freq: Once | ORAL | Status: AC
  Administered 2016-10-09: 0.1 mg via ORAL
  Filled 2016-10-09: qty 1

## 2016-10-09 MED ORDER — IOPAMIDOL (ISOVUE-300) INJECTION 61%
100.0000 mL | Freq: Once | INTRAVENOUS | Status: AC | PRN
  Administered 2016-10-09: 100 mL via INTRAVENOUS

## 2016-10-09 MED ORDER — KETOROLAC TROMETHAMINE 10 MG PO TABS: 10.0000 mg | ORAL_TABLET | Freq: Four times a day (QID) | ORAL | 0 refills | Status: DC | PRN

## 2016-10-09 NOTE — ED Notes (Signed)
Pt states that he just used the BR and is unable to provide a urine sample at this time. Pt also reports that he has not had any of three BP medications that he is supposed to be taking x 1 year.

## 2016-10-11 NOTE — ED Provider Notes (Signed)
[facility] Emergency Department Provider Note   First MD Initiated Contact with Patient 10/09/16 0139     (approximate)  I have reviewed the triage vital signs and the nursing notes.   HISTORY  Chief Complaint Abdominal Pain    HPI Paul Howard is a 56 y.o. 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 Patient states his current pain score is 9 out of 10. Patient denies any vomiting or diarrhea. Patient denies any fever. Patient states last EtOH ingestion was 2 days ago. Patient also admits to cocaine use last night  Past Medical History:  Diagnosis Date  . Asthma   . Hypertension   . Pancreatitis     Patient Active Problem List   Diagnosis Date Noted  . Acute epigastric pain 10/22/2014  . AKI (acute kidney injury) ([place]) 10/22/2014  . HTN (hypertension) 10/22/2014  . Pancreatitis, alcoholic, acute 51/76/1607    Past Surgical History:  Procedure Laterality Date  . TONSILLECTOMY      Prior to Admission medications   Medication Sig Start Date End Date Taking? Authorizing Provider  amLODipine (NORVASC) 10 MG tablet Take 1 tablet (10 mg total) by mouth daily. 10/26/14   Bettey Costa, MD  cloNIDine (CATAPRES) 0.1 MG tablet Take 1 tablet (0.1 mg total) by mouth 2 (two) times daily. 09/24/14   Fritzi Mandes, MD  folic acid (FOLVITE) 1 MG tablet Take 1 tablet (1 mg total) by mouth daily. 09/24/14   Fritzi Mandes, MD  hydrALAZINE (APRESOLINE) 25 MG tablet Take 1 tablet (25 mg total) by mouth 2 (two) times daily. 10/26/14   Bettey Costa, MD  ketorolac (TORADOL) 10 MG tablet Take 1 tablet (10 mg total) by mouth every 6 (six) hours as needed. 10/09/16   Gregor Hams, MD  pantoprazole (PROTONIX) 40 MG tablet Take 1 tablet (40 mg total) by mouth daily. 10/26/14   Bettey Costa, MD    Allergies No known drug  allergies  Family History  Problem Relation Age of Onset  . Hypertension Mother   . Throat cancer Father     Social History Social History  Substance Use Topics  . Smoking status: Never Smoker  . Smokeless tobacco: Not on file  . Alcohol use 1.8 oz/week    3 Cans of beer per week    Review of Systems Constitutional: No fever/chills Eyes: No visual changes. ENT: No sore throat. Cardiovascular: Denies chest pain. Respiratory: Denies shortness of breath. Gastrointestinal: Positive for abdominal pain  No nausea, no vomiting.  No diarrhea.  No constipation. Genitourinary: Negative for dysuria. Musculoskeletal: Negative for neck pain.  Negative for back pain. Integumentary: Negative for rash. Neurological: Negative for headaches, focal weakness or numbness.  ____________________________________________   PHYSICAL EXAM:  VITAL SIGNS: ED Triage Vitals  Enc Vitals Group     BP 10/08/16 2053 (!) 185/131     Pulse Rate 10/08/16 2053 79     Resp 10/08/16 2053 16     Temp 10/08/16 2053 98.7 F (37.1 C)     Temp Source 10/08/16 2053 Oral     SpO2 10/08/16 2053 97 %     Weight 10/08/16 2054 77.1 kg (170 lb)     Height 10/08/16 2054 1.676 m (5\' 6" )     Head Circumference --      Peak Flow --      Pain  Score 10/08/16 2053 9     Pain Loc --      Pain Edu? --      Excl. in [place]? --     Constitutional: Alert and oriented. Well appearing and in no acute distress. Eyes: Conjunctivae are normal.  Head: Atraumatic. Mouth/Throat: Mucous membranes are moist. Oropharynx non-erythematous. Neck: No stridor. Cardiovascular: Normal rate, regular rhythm. Good peripheral circulation. Grossly normal heart sounds. Respiratory: Normal respiratory effort.  No retractions. Lungs CTAB. Gastrointestinal: Epigastric tenderness to palpation. No distention.   Musculoskeletal: No lower extremity tenderness nor edema. No gross deformities of extremities. Neurologic:  Normal speech and language. No  gross focal neurologic deficits are appreciated.  Skin:  Skin is warm, dry and intact. No rash noted. Psychiatric: Mood and affect are normal. Speech and behavior are normal.  ____________________________________________   LABS (all labs ordered are listed, but only abnormal results are displayed)  Labs Reviewed  COMPREHENSIVE METABOLIC PANEL - Abnormal; Notable for the following:       Result Value   Potassium 2.7 (*)    BUN 21 (*)    All other components within normal limits  CBC - Abnormal; Notable for the following:    RBC 4.37 (*)    HCT 39.2 (*)    All other components within normal limits  URINALYSIS, COMPLETE (UACMP) WITH MICROSCOPIC - Abnormal; Notable for the following:    Color, Urine STRAW (*)    APPearance CLEAR (*)    Glucose, UA 50 (*)    All other components within normal limits  URINE DRUG SCREEN, QUALITATIVE ([facility] ONLY) - Abnormal; Notable for the following:    Cocaine Metabolite,Ur [place] POSITIVE (*)    Benzodiazepine, Ur Scrn POSITIVE (*)    All other components within normal limits  LIPASE, BLOOD  TROPONIN I   ____________________________________________  EKG  ED ECG REPORT I, Angus N Kaylor Simenson, the attending physician, personally viewed and interpreted this ECG.   Date: 10/11/2016  EKG Time: 8:57PM  Rate: 71  Rhythm: Normal Sinus Rhythm  Axis: Normal  Intervals:Normal  ST&T Change: None   Procedures   ____________________________________________   INITIAL IMPRESSION / ASSESSMENT AND PLAN / ED COURSE  Pertinent labs & imaging results that were available during my care of the patient were reviewed by me and considered in my medical decision making (see chart for details).        ____________________________________________  FINAL CLINICAL IMPRESSION(S) / ED DIAGNOSES  Final diagnoses:  Right inguinal hernia  Hypokalemia     MEDICATIONS GIVEN DURING THIS VISIT:  Medications  potassium chloride SA (K-DUR,KLOR-CON) CR tablet 40  mEq (40 mEq Oral Given 10/08/16 2238)  ketorolac (TORADOL) 30 MG/ML injection 30 mg (30 mg Intravenous Given 10/09/16 0213)  ondansetron (ZOFRAN) injection 4 mg (4 mg Intravenous Given 10/09/16 0213)  iopamidol (ISOVUE-300) 61 % injection 100 mL (100 mLs Intravenous Contrast Given 10/09/16 0219)  cloNIDine (CATAPRES) tablet 0.1 mg (0.1 mg Oral Given 10/09/16 0332)     NEW OUTPATIENT MEDICATIONS STARTED DURING THIS VISIT:  Discharge Medication List as of 10/09/2016  4:31 AM    START taking these medications   Details  ketorolac (TORADOL) 10 MG tablet Take 1 tablet (10 mg total) by mouth every 6 (six) hours as needed., Starting Sat 10/09/2016, Print        Discharge Medication List as of 10/09/2016  4:31 AM      Discharge Medication List as of 10/09/2016  4:31 AM       Note:  This document was prepared using Dragon voice recognition software and may include unintentional dictation errors.    Gregor Hams, MD 10/11/16 [phone]

## 2016-10-25 ENCOUNTER — Ambulatory Visit: Payer: Self-pay | Admitting: General Surgery

## 2016-11-01 ENCOUNTER — Encounter: Payer: Self-pay | Admitting: General Practice

## 2016-11-01 ENCOUNTER — Telehealth: Payer: Self-pay | Admitting: General Practice

## 2016-11-01 ENCOUNTER — Ambulatory Visit: Payer: Self-pay | Admitting: Surgery

## 2016-11-01 ENCOUNTER — Encounter: Payer: Self-pay | Admitting: Surgery

## 2016-11-01 NOTE — Telephone Encounter (Signed)
Unable to leave message on patients phone number, I have mailed a letter asking patient to give our office a call to make this appointment. Please schedule if patient calls back.

## 2016-11-05 ENCOUNTER — Telehealth: Payer: Self-pay

## 2016-11-05 NOTE — Telephone Encounter (Signed)
Patient called regarding a letter that was sent to him about an appointment that he missed. He stated that his phone has been off and is now back on. I offered the patient 2 appointments with Dr. Burt Knack in September. He declined both stated that he will be on vacation. I asked if he would like to call us back with when he is able to schedule an appointment and he stated that he would.

## 2017-01-03 ENCOUNTER — Encounter: Payer: Self-pay | Admitting: *Deleted

## 2017-01-03 ENCOUNTER — Emergency Department
Admission: EM | Admit: 2017-01-03 | Discharge: 2017-01-03 | Disposition: A | Discharge status: Home / Self Care | Payer: Self-pay | Attending: Emergency Medicine | Admitting: Emergency Medicine

## 2017-01-03 DIAGNOSIS — J45909 Unspecified asthma, uncomplicated: Secondary | ICD-10-CM | POA: Insufficient documentation

## 2017-01-03 DIAGNOSIS — I10 Essential (primary) hypertension: Secondary | ICD-10-CM | POA: Insufficient documentation

## 2017-01-03 DIAGNOSIS — Z79899 Other long term (current) drug therapy: Secondary | ICD-10-CM | POA: Insufficient documentation

## 2017-01-03 DIAGNOSIS — K2900 Acute gastritis without bleeding: Secondary | ICD-10-CM | POA: Insufficient documentation

## 2017-01-03 LAB — CBC
HEMATOCRIT: 41.6 % (ref 40.0–52.0)
Hemoglobin: 14 g/dL (ref 13.0–18.0)
MCH: 30.7 pg (ref 26.0–34.0)
MCHC: 33.6 g/dL (ref 32.0–36.0)
MCV: 91.3 fL (ref 80.0–100.0)
Platelets: 191 10*3/uL (ref 150–440)
RBC: 4.55 MIL/uL (ref 4.40–5.90)
RDW: 14.1 % (ref 11.5–14.5)
WBC: 9.2 10*3/uL (ref 3.8–10.6)

## 2017-01-03 LAB — COMPREHENSIVE METABOLIC PANEL
ALT: 20 U/L (ref 17–63)
ANION GAP: 11 (ref 5–15)
AST: 26 U/L (ref 15–41)
Albumin: 3.9 g/dL (ref 3.5–5.0)
Alkaline Phosphatase: 60 U/L (ref 38–126)
BILIRUBIN TOTAL: 0.9 mg/dL (ref 0.3–1.2)
BUN: 25 mg/dL — ABNORMAL HIGH (ref 6–20)
CO2: 26 mmol/L (ref 22–32)
Calcium: 8.7 mg/dL — ABNORMAL LOW (ref 8.9–10.3)
Chloride: 99 mmol/L — ABNORMAL LOW (ref 101–111)
Creatinine, Ser: 1.42 mg/dL — ABNORMAL HIGH (ref 0.61–1.24)
GFR, EST NON AFRICAN AMERICAN: 54 mL/min — AB (ref >60)
Glucose, Bld: 105 mg/dL — ABNORMAL HIGH (ref 65–99)
POTASSIUM: 3.3 mmol/L — AB (ref 3.5–5.1)
Sodium: 136 mmol/L (ref 135–145)
TOTAL PROTEIN: 7.5 g/dL (ref 6.5–8.1)

## 2017-01-03 LAB — TROPONIN I

## 2017-01-03 LAB — LIPASE, BLOOD: LIPASE: 22 U/L (ref 11–51)

## 2017-01-03 MED ORDER — SUCRALFATE 1 G PO TABS: 1.0000 g | ORAL_TABLET | Freq: Four times a day (QID) | ORAL | 0 refills | Status: DC

## 2017-01-03 MED ORDER — PANTOPRAZOLE SODIUM 20 MG PO TBEC: 20.0000 mg | DELAYED_RELEASE_TABLET | Freq: Every day | ORAL | 1 refills | Status: DC

## 2017-01-03 MED ORDER — GI COCKTAIL ~~LOC~~
30.0000 mL | Freq: Once | ORAL | Status: AC
  Administered 2017-01-03: 30 mL via ORAL
  Filled 2017-01-03: qty 30

## 2017-01-03 NOTE — ED Notes (Signed)
E-signature page printed and pt signed

## 2017-01-03 NOTE — ED Notes (Signed)
Pt resting in bed, denies any needs, resp even and unlabored

## 2017-01-03 NOTE — ED Triage Notes (Signed)
Pt arrives via EMS from home, states this AM he developed general abd pain, denies any nausea or vomiting, states hx of pancreatitis, last ETOH last night, awake and alert in no acute distress upon arrival

## 2017-01-03 NOTE — ED Notes (Signed)
EDP at bedside  

## 2017-01-03 NOTE — ED Provider Notes (Signed)
[facility] Emergency Department Provider Note   ____________________________________________    I have reviewed the triage vital signs and the nursing notes.   HISTORY  Chief Complaint Abdominal Pain     HPI Paul Howard is a 56 y.o. male Who presents with complaints of abdominal pain. Patient describes epigastric burning/sharp pain which is mild to moderate. He noted this started this morning. Patient does admit to drinking alcohol last night. He reports he does not drink daily. He does have a history of pancreatitis and notes this feels somewhat similar. He has no chest pain or shortness of breath. No fevers or chills reported. He has not taken anything for this.occasionally pain radiates to his back   Past Medical History:  Diagnosis Date  . Asthma   . Hypertension   . Pancreatitis     Patient Active Problem List   Diagnosis Date Noted  . Acute epigastric pain 10/22/2014  . AKI (acute kidney injury) ([place]) 10/22/2014  . HTN (hypertension) 10/22/2014  . Pancreatitis, alcoholic, acute 60/12/9321    Past Surgical History:  Procedure Laterality Date  . TONSILLECTOMY      Prior to Admission medications   Medication Sig Start Date End Date Taking? Authorizing Provider  amLODipine (NORVASC) 10 MG tablet Take 1 tablet (10 mg total) by mouth daily. 10/26/14   Bettey Costa, MD  cloNIDine (CATAPRES) 0.1 MG tablet Take 1 tablet (0.1 mg total) by mouth 2 (two) times daily. 09/24/14   Fritzi Mandes, MD  folic acid (FOLVITE) 1 MG tablet Take 1 tablet (1 mg total) by mouth daily. 09/24/14   Fritzi Mandes, MD  hydrALAZINE (APRESOLINE) 25 MG tablet Take 1 tablet (25 mg total) by mouth 2 (two) times daily. 10/26/14   Bettey Costa, MD  ketorolac (TORADOL) 10 MG tablet Take 1 tablet (10 mg total) by mouth every 6 (six) hours as needed. 10/09/16   Gregor Hams, MD  pantoprazole (PROTONIX) 20 MG tablet Take 1 tablet (20 mg total) by mouth daily. 01/03/17 01/03/18   Lavonia Drafts, MD  sucralfate (CARAFATE) 1 g tablet Take 1 tablet (1 g total) by mouth 4 (four) times daily. 01/03/17 01/03/18  Lavonia Drafts, MD     Allergies Patient has no known allergies.  Family History  Problem Relation Age of Onset  . Hypertension Mother   . Throat cancer Father     Social History Social History  Substance Use Topics  . Smoking status: Never Smoker  . Smokeless tobacco: Not on file  . Alcohol use 1.8 oz/week    3 Cans of beer per week    Review of Systems  Constitutional: No fever/chills Eyes: No visual changes.  ENT: No sore throat. Cardiovascular: Denies chest pain. Respiratory: Denies shortness of breath. Gastrointestinal:abdominal pain as above.  No nausea, no vomiting.   Genitourinary: Negative for dysuria. Musculoskeletal: Negative for back pain. Skin: Negative for rash. Neurological: Negative for headaches    ____________________________________________   PHYSICAL EXAM:  VITAL SIGNS: ED Triage Vitals  Enc Vitals Group     BP 01/03/17 0922 (!) 212/127     Pulse Rate 01/03/17 0922 70     Resp 01/03/17 0922 18     Temp 01/03/17 0922 98 F (36.7 C)     Temp Source 01/03/17 0922 Oral     SpO2 01/03/17 0922 99 %     Weight 01/03/17 0919 75.8 kg (167 lb)     Height 01/03/17 0919 1.676 m (5\' 6" )  Head Circumference --      Peak Flow --      Pain Score 01/03/17 0918 10     Pain Loc --      Pain Edu? --      Excl. in [place]? --     Constitutional: Alert and oriented. No acute distress. Pleasant and interactive Eyes: Conjunctivae are normal.   Nose: No congestion/rhinnorhea. Mouth/Throat: Mucous membranes are moist.    Cardiovascular: Normal rate, regular rhythm. Grossly normal heart sounds.  Good peripheral circulation. Respiratory: Normal respiratory effort.  No retractions. Lungs CTAB. Gastrointestinal: reassuring exam.Soft and nontender. No distention.  No CVA tenderness.no pulsatile mass Genitourinary:  deferred Musculoskeletal:   Warm and well perfused Neurologic:  Normal speech and language. No gross focal neurologic deficits are appreciated.  Skin:  Skin is warm, dry and intact. No rash noted. Psychiatric: Mood and affect are normal. Speech and behavior are normal.  ____________________________________________   LABS (all labs ordered are listed, but only abnormal results are displayed)  Labs Reviewed  COMPREHENSIVE METABOLIC PANEL - Abnormal; Notable for the following:       Result Value   Potassium 3.3 (*)    Chloride 99 (*)    Glucose, Bld 105 (*)    BUN 25 (*)    Creatinine, Ser 1.42 (*)    Calcium 8.7 (*)    GFR calc non Af Amer 54 (*)    All other components within normal limits  CBC  TROPONIN I  LIPASE, BLOOD   ____________________________________________  EKG  ED ECG REPORT I, Lavonia Drafts, the attending physician, personally viewed and interpreted this ECG.  Date: 01/03/2017  Rhythm: normal sinus rhythm QRS Axis: normal Intervals: normal ST/T Wave abnormalities: flipped T waves inferiorly and laterally, some of these are old   ____________________________________________  RADIOLOGY  chest x-ray unremarkable ____________________________________________   PROCEDURES  Procedure(s) performed: No    Critical Care performed: No ____________________________________________   INITIAL IMPRESSION / ASSESSMENT AND PLAN / ED COURSE  Pertinent labs & imaging results that were available during my care of the patient were reviewed by me and considered in my medical decision making (see chart for details).  patient presents with epigastric pain. differential diagnosis includes gastritis/GERD, pancreatitis, nonspecific abdominal pain.His exam is reassuring. We will check labs, trial of GI cocktail and reevaluate.some flipped T waves on EKG, most of these are old and the patient denies chest pain. Troponin sent as well  lab work is reassuring, lipase is  normal. Patient is mildly dehydrated. Patient had complete resolution of pain after GI cocktail. Suspect gastritis as the cause of his discomfort.Will start PPI and Carafate have the patient follow up with his PCP.    ____________________________________________   FINAL CLINICAL IMPRESSION(S) / ED DIAGNOSES  Final diagnoses:  Acute gastritis without hemorrhage, unspecified gastritis type      NEW MEDICATIONS STARTED DURING THIS VISIT:  Discharge Medication List as of 01/03/2017 10:53 AM    START taking these medications   Details  sucralfate (CARAFATE) 1 g tablet Take 1 tablet (1 g total) by mouth 4 (four) times daily., Starting Mon 01/03/2017, Until Tue 01/03/2018, Print         Note:  This document was prepared using Dragon voice recognition software and may include unintentional dictation errors.    Lavonia Drafts, MD 01/03/17 [phone]

## 2017-01-13 ENCOUNTER — Ambulatory Visit: Payer: Self-pay | Admitting: Surgery

## 2017-01-19 ENCOUNTER — Ambulatory Visit (INDEPENDENT_AMBULATORY_CARE_PROVIDER_SITE_OTHER): Payer: Self-pay | Admitting: Surgery

## 2017-01-19 ENCOUNTER — Encounter: Payer: Self-pay | Admitting: Surgery

## 2017-01-19 VITALS — BP 201/124 | HR 88 | Temp 98.0°F | Ht 66.0 in | Wt 175.8 lb

## 2017-01-19 DIAGNOSIS — K4090 Unilateral inguinal hernia, without obstruction or gangrene, not specified as recurrent: Secondary | ICD-10-CM

## 2017-01-19 NOTE — Progress Notes (Signed)
01/19/2017  History of Present Illness: Paul Howard is a 56 y.o. male presenting for evaluation of a right inguinal hernia.  He has imaging studies showing a right inguinal hernia as far back as 2016, though on a more recent CT on 10/09/16, there is a short segment of distal ileum in the hernia sac.  He has missed several appointments with [place] over the past two months.    Overall, he reports that initially the hernia was not causing any symptoms and was smaller.  However, more recently, he's been having some pain and discomfort at the right groin with the hernia bulging out.  The pain is not constant and is more present at the end of the day after he's been standing at work all day and also when he's doing heavy lifting at work.  Otherwise is better at night when he's lying flat and does reduce on its own.  He has not tried to manually reduce it while at work.  Denies any fevers, chills, chest pain, shortness of breath, nausea, vomiting, abdominal pain, constipation, or diarrhea.  He does describe and urinary issues with decreased urine stream.  Of note, he has a history of EtOH pancreatitis and on one ED note, there is mention of cocaine use.  He has not seen a PCP in a long time and ran out of his blood pressure medications.  Past Medical History: Past Medical History:  Diagnosis Date  . Acute epigastric pain 10/22/2014  . AKI (acute kidney injury) ([place]) 10/22/2014  . Asthma   . HTN (hypertension) 10/22/2014  . Hypertension   . Pancreatitis   . Pancreatitis, alcoholic, acute 0/05/5007     Past Surgical History: Past Surgical History:  Procedure Laterality Date  . TONSILLECTOMY      Home Medications: Prior to Admission medications   Lisinopril, Norvasc    Allergies: No Known Allergies  Social History:  reports that he has never smoked. He has never used smokeless tobacco. He reports that he drinks about 1.8 oz of alcohol per week . He reports that he uses drugs, including Cocaine.    Family History: Family History  Problem Relation Age of Onset  . Hypertension Mother   . Throat cancer Father     Review of Systems: Review of Systems  Constitutional: Negative for chills and fever.  HENT: Negative for hearing loss.   Eyes: Negative for blurred vision.  Respiratory: Negative for shortness of breath.   Cardiovascular: Negative for chest pain.  Gastrointestinal: Negative for abdominal pain, constipation, diarrhea, nausea and vomiting.  Genitourinary: Negative for dysuria.  Musculoskeletal: Negative for myalgias.  Skin: Negative for rash.  Neurological: Negative for dizziness.  Psychiatric/Behavioral: Negative for depression.  All other systems reviewed and are negative.   Physical Exam BP (!) 201/124 (BP Location: Right Arm, Cuff Size: Large)   Pulse 88   Temp 98 F (36.7 C) (Oral)   Ht 5\' 6"  (1.676 m)   Wt 79.7 kg (175 lb 12.8 oz)   BMI 28.37 kg/m  CONSTITUTIONAL: No acute distress HEENT:  Normocephalic, atraumatic, extraocular motion intact. NECK: Trachea is midline, and there is no jugular venous distension.  RESPIRATORY:  Lungs are clear, and breath sounds are equal bilaterally. Normal respiratory effort without pathologic use of accessory muscles. CARDIOVASCULAR: Heart is regular without murmurs, gallops, or rubs. GI: The abdomen is soft, nondistended, nontender to palpation.  Patient has a right inguinal hernia, reducible, with only mild discomfort on palpation.  No hernia over left groin.  There were no palpable masses.  MUSCULOSKELETAL:  Normal muscle strength and tone in all four extremities.  No peripheral edema or cyanosis. SKIN: Skin turgor is normal. There are no pathologic skin lesions.  NEUROLOGIC:  Motor and sensation is grossly normal.  Cranial nerves are grossly intact. PSYCH:  Alert and oriented to person, place and time. Affect is normal.  Labs/Imaging: --None recent  Assessment and Plan: This is a 56 y.o. male who presents with a  reducible right inguinal hernia, which is causing some discomfort symptoms more recently.  Discussed with the patient that currently with his blood pressure not well controlled, he would not be a good candidate for surgery.  We have given him a referral back to the community clinic where he can be seen and he can get his prescriptions refilled.  Once his blood pressure is better controlled and he can be cleared for surgery, he will follow up with [place] next month to discuss surgery.  In the meantime, have instructed the patient on how to manually reduce his hernia so he can decrease the discomfort he has particularly at the end of the day.  Also instructed him about getting a hernia truss or underwear to help keep the hernia reduced.  He will follow up next month to discuss surgery.  We'll also provide him with financial assistance information so he can apply for medicaid and charity care through [facility].  Patient understands this plan and all of his questions have been answered.  Face-to-face time spent with the patient and care providers was 45 minutes, with more than 50% of the time spent counseling, educating, and coordinating care of the patient.     Paul Howard, [name]

## 2017-01-19 NOTE — Patient Instructions (Addendum)
You will need to be seen by Primary Care prior to having surgery due to your extremely high Blood Pressure. We will place a referral to [facility] once again today and you will need to fill out the information packet given to take with you to your appointment. Please review Stroke symptoms below and if you experience any of these symptoms, go IMMEDIATELY to the Emergency Room.  Stroke Prevention Some medical conditions and behaviors are associated with an increased chance of having a stroke. You may prevent a stroke by making healthy choices and managing medical conditions. How can I reduce my risk of having a stroke?  Stay physically active. Get at least 30 minutes of activity on most or all days.  Do not smoke. It may also be helpful to avoid exposure to secondhand smoke.  Limit alcohol use. Moderate alcohol use is considered to be: ? No more than 2 drinks per day for men. ? No more than 1 drink per day for nonpregnant women.  Eat healthy foods. This involves: ? Eating 5 or more servings of fruits and vegetables a day. ? Making dietary changes that address high blood pressure (hypertension), high cholesterol, diabetes, or obesity.  Manage your cholesterol levels. ? Making food choices that are high in fiber and low in saturated fat, trans fat, and cholesterol may control cholesterol levels. ? Take any prescribed medicines to control cholesterol as directed by your health care provider.  Manage your diabetes. ? Controlling your carbohydrate and sugar intake is recommended to manage diabetes. ? Take any prescribed medicines to control diabetes as directed by your health care provider.  Control your hypertension. ? Making food choices that are low in salt (sodium), saturated fat, trans fat, and cholesterol is recommended to manage hypertension. ? Ask your health care provider if you need treatment to lower your blood pressure. Take any prescribed medicines to control hypertension as  directed by your health care provider. ? If you are 18-90 years of age, have your blood pressure checked every 3-5 years. If you are 15 years of age or older, have your blood pressure checked every year.  Maintain a healthy weight. ? Reducing calorie intake and making food choices that are low in sodium, saturated fat, trans fat, and cholesterol are recommended to manage weight.  Stop drug abuse.  Avoid taking birth control pills. ? Talk to your health care provider about the risks of taking birth control pills if you are over 15 years old, smoke, get migraines, or have ever had a blood clot.  Get evaluated for sleep disorders (sleep apnea). ? Talk to your health care provider about getting a sleep evaluation if you snore a lot or have excessive sleepiness.  Take medicines only as directed by your health care provider. ? For some people, aspirin or blood thinners (anticoagulants) are helpful in reducing the risk of forming abnormal blood clots that can lead to stroke. If you have the irregular heart rhythm of atrial fibrillation, you should be on a blood thinner unless there is a good reason you cannot take them. ? Understand all your medicine instructions.  Make sure that other conditions (such as anemia or atherosclerosis) are addressed. Get help right away if:  You have sudden weakness or numbness of the face, arm, or leg, especially on one side of the body.  Your face or eyelid droops to one side.  You have sudden confusion.  You have trouble speaking (aphasia) or understanding.  You have sudden trouble  seeing in one or both eyes.  You have sudden trouble walking.  You have dizziness.  You have a loss of balance or coordination.  You have a sudden, severe headache with no known cause.  You have new chest pain or an irregular heartbeat. Any of these symptoms may represent a serious problem that is an emergency. Do not wait to see if the symptoms will go away. Get medical  help at once. Call your local emergency services (911 in [place] Do not drive yourself to the hospital. This information is not intended to replace advice given to you by your health care provider. Make sure you discuss any questions you have with your health care provider. Document Released: 04/15/2004 Document Revised: 08/14/2015 Document Reviewed: 09/08/2012 Elsevier Interactive Patient Education  2017 [employer].      We will see you back in the office after you have been cleared by Primary Care, to talk about surgery on your Hernia.  You will need to arrange to be out of work for 2 weeks and then return with a lifting restrictions for 4 more weeks. Please send any FMLA paperwork prior to surgery and we will fill this out and fax it back to your employer within 3 business days.  You may have a bruise in your groin and also swelling and brusing in your testicle area. You may use ice 4-5 times daily for 15-20 minutes each time. Make sure that you place a barrier between you and the ice pack. To decrease the swelling, you may roll up a bath towel and place it vertically in between your thighs with your testicles resting on the towel. You will want to keep this area elevated as much as possible for several days following surgery.    Inguinal Hernia, Adult Muscles help keep everything in the body in its proper place. But if a weak spot in the muscles develops, something can poke through. That is called a hernia. When this happens in the lower part of the belly (abdomen), it is called an inguinal hernia. (It takes its name from a part of the body in this region called the inguinal canal.) A weak spot in the wall of muscles lets some fat or part of the small intestine bulge through. An inguinal hernia can develop at any age. Men get them more often than women. CAUSES  In adults, an inguinal hernia develops over time.  It can be triggered by:  Suddenly straining the muscles of the lower  abdomen.  Lifting heavy objects.  Straining to have a bowel movement. Difficult bowel movements (constipation) can lead to this.  Constant coughing. This may be caused by smoking or lung disease.  Being overweight.  Being pregnant.  Working at a job that requires long periods of standing or heavy lifting.  Having had an inguinal hernia before. One type can be an emergency situation. It is called a strangulated inguinal hernia. It develops if part of the small intestine slips through the weak spot and cannot get back into the abdomen. The blood supply can be cut off. If that happens, part of the intestine may die. This situation requires emergency surgery. SYMPTOMS  Often, a small inguinal hernia has no symptoms. It is found when a healthcare provider does a physical exam. Larger hernias usually have symptoms.   In adults, symptoms may include:  A lump in the groin. This is easier to see when the person is standing. It might disappear when lying down.  In  men, a lump in the scrotum.  Pain or burning in the groin. This occurs especially when lifting, straining or coughing.  A dull ache or feeling of pressure in the groin.  Signs of a strangulated hernia can include:  A bulge in the groin that becomes very painful and tender to the touch.  A bulge that turns red or purple.  Fever, nausea and vomiting.  Inability to have a bowel movement or to pass gas. DIAGNOSIS  To decide if you have an inguinal hernia, a healthcare provider will probably do a physical examination.  This will include asking questions about any symptoms you have noticed.  The healthcare provider might feel the groin area and ask you to cough. If an inguinal hernia is felt, the healthcare provider may try to slide it back into the abdomen.  Usually no other tests are needed. TREATMENT  Treatments can vary. The size of the hernia makes a difference. Options include:  Watchful waiting. This is often  suggested if the hernia is small and you have had no symptoms.  No medical procedure will be done unless symptoms develop.  You will need to watch closely for symptoms. If any occur, contact your healthcare provider right away.  Surgery. This is used if the hernia is larger or you have symptoms.  Open surgery. This is usually an outpatient procedure (you will not stay overnight in a hospital). An cut (incision) is made through the skin in the groin. The hernia is put back inside the abdomen. The weak area in the muscles is then repaired by herniorrhaphy or hernioplasty. Herniorrhaphy: in this type of surgery, the weak muscles are sewn back together. Hernioplasty: a patch or mesh is used to close the weak area in the abdominal wall.  Laparoscopy. In this procedure, a surgeon makes small incisions. A thin tube with a tiny video camera (called a laparoscope) is put into the abdomen. The surgeon repairs the hernia with mesh by looking with the video camera and using two long instruments. HOME CARE INSTRUCTIONS   After surgery to repair an inguinal hernia:  You will need to take pain medicine prescribed by your healthcare provider. Follow all directions carefully.  You will need to take care of the wound from the incision.  Your activity will be restricted for awhile. This will probably include no heavy lifting for several weeks. You also should not do anything too active for a few weeks. When you can return to work will depend on the type of job that you have.  During "watchful waiting" periods, you should:  Maintain a healthy weight.  Eat a diet high in fiber (fruits, vegetables and whole grains).  Drink plenty of fluids to avoid constipation. This means drinking enough water and other liquids to keep your urine clear or pale yellow.  Do not lift heavy objects.  Do not stand for long periods of time.  Quit smoking. This should keep you from developing a frequent cough. SEEK MEDICAL  CARE IF:   A bulge develops in your groin area.  You feel pain, a burning sensation or pressure in the groin. This might be worse if you are lifting or straining.  You develop a fever of more than 100.5 F (38.1 C). SEEK IMMEDIATE MEDICAL CARE IF:   Pain in the groin increases suddenly.  A bulge in the groin gets bigger suddenly and does not go down.  For men, there is sudden pain in the scrotum. Or, the size of the  scrotum increases.  A bulge in the groin area becomes red or purple and is painful to touch.  You have nausea or vomiting that does not go away.  You feel your heart beating much faster than normal.  You cannot have a bowel movement or pass gas.  You develop a fever of more than 102.0 F (38.9 C).   This information is not intended to replace advice given to you by your health care provider. Make sure you discuss any questions you have with your health care provider.   Document Released: 07/25/2008 Document Revised: 05/31/2011 Document Reviewed: 09/09/2014 Elsevier Interactive Patient Education [employer].    We have seen you today for an Inguinal Hernia.  You may get a "Hernia Truss" at a Medical Supply Store. A hernia truss will provide some counter pressure against your hernia and help control your pain and give you more comfort in that area.  A few in our area are:  [facility] [address] [place], [place] ([place], [place], [place] [phone]  [facility] [place], [place], [place] [phone]  If the hernia is causing more pain and has a firm bulge in this area, lie down and try to push the protruding part in slowly and gently. It is much easier to get this to go back in, when you are lying flat.  Highlighted below are the urgent symptoms with a Strangulated hernia. This is a medical emergency and if you experience any of these  symptoms, go immediately to the emergency department.    Inguinal Hernia, Adult An inguinal hernia is when fat or the intestines push through the area where the leg meets the lower abdomen (groin) and create a rounded lump (bulge). This condition develops over time. There are three types of inguinal hernias. These types include:  Hernias that can be pushed back into the belly (are reducible).  Hernias that are not reducible (are incarcerated).  Hernias that are not reducible and lose their blood supply (are strangulated). This type of hernia requires emergency surgery.  What are the causes? This condition is caused by having a weak spot in the muscles or tissue. This weakness lets the hernia poke through. This condition can be triggered by:  Suddenly straining the muscles of the lower abdomen.  Lifting heavy objects.  Straining to have a bowel movement. Difficult bowel movements (constipation) can lead to this.  Coughing.  What increases the risk? This condition is more likely to develop in:  Men.  Pregnant women.  People who: ? Are overweight. ? Work in jobs that require long periods of standing or heavy lifting. ? Have had an inguinal hernia before. ? Smoke or have lung disease. These factors can lead to long-lasting (chronic) coughing.  What are the signs or symptoms? Symptoms can depend on the size of the hernia. Often, a small inguinal hernia has no symptoms. Symptoms of a larger hernia include:  A lump in the groin. This is easier to see when the person is standing. It might not be visible when he or she is lying down.  Pain or burning in the groin. This occurs especially when lifting, straining, or coughing.  A dull ache or a feeling of pressure in the groin.  A lump in the scrotum in men.  Symptoms of a strangulated inguinal hernia can include:  A bulge in the groin that is very painful and tender to the touch.  A bulge that turns red or purple.  Fever,  nausea, and vomiting.  The inability to have a bowel movement or to pass gas.  How is this diagnosed? This condition is diagnosed with a medical history and physical exam. Your health care provider may feel your groin area and ask you to cough. How is this treated? Treatment for this condition varies depending on the size of your hernia and whether you have symptoms. If you do not have symptoms, your health care provider may have you watch your hernia carefully and come in for follow-up visits. If your hernia is larger or if you have symptoms, your treatment will include surgery. Follow these instructions at home: Lifestyle  Drink enough fluid to keep your urine clear or pale yellow.  Eat a diet that includes a lot of fiber. Eat plenty of fruits, vegetables, and whole grains. Talk with your health care provider if you have questions.  Avoid lifting heavy objects.  Avoid standing for long periods of time.  Do not use tobacco products, including cigarettes, chewing tobacco, or e-cigarettes. If you need help quitting, ask your health care provider.  Maintain a healthy weight. General instructions  Do not try to force the hernia back in.  Watch your hernia for any changes in color or size. Let your health care provider know if any changes occur.  Take over-the-counter and prescription medicines only as told by your health care provider.  Keep all follow-up visits as told by your health care provider. This is important. Contact a health care provider if:  You have a fever.  You have new symptoms.  Your symptoms get worse. Get help right away if:  You have pain in the groin that suddenly gets worse.  A bulge in the groin gets bigger suddenly and does not go down.  You are a man and you have a sudden pain in the scrotum, or the size of your scrotum suddenly changes.  A bulge in the groin area becomes red or purple and is painful to the touch.  You have nausea or vomiting that  does not go away.  You feel your heart beating a lot more quickly than normal.  You cannot have a bowel movement or pass gas. This information is not intended to replace advice given to you by your health care provider. Make sure you discuss any questions you have with your health care provider. Document Released: 07/25/2008 Document Revised: 08/14/2015 Document Reviewed: 01/16/2014 Elsevier Interactive Patient Education  2018 [employer].

## 2017-02-01 ENCOUNTER — Telehealth: Payer: Self-pay | Admitting: Adult Health Nurse Practitioner

## 2017-02-01 NOTE — Telephone Encounter (Signed)
Former patient referred by hospital back to [facility]-- wants a phone call.

## 2017-10-27 ENCOUNTER — Emergency Department: Payer: Self-pay

## 2017-10-27 ENCOUNTER — Emergency Department
Admission: EM | Admit: 2017-10-27 | Discharge: 2017-10-27 | Disposition: A | Discharge status: Home / Self Care | Payer: Self-pay | Attending: Emergency Medicine | Admitting: Emergency Medicine

## 2017-10-27 ENCOUNTER — Encounter: Payer: Self-pay | Admitting: Emergency Medicine

## 2017-10-27 ENCOUNTER — Other Ambulatory Visit: Payer: Self-pay

## 2017-10-27 DIAGNOSIS — I10 Essential (primary) hypertension: Secondary | ICD-10-CM | POA: Insufficient documentation

## 2017-10-27 DIAGNOSIS — R1013 Epigastric pain: Secondary | ICD-10-CM | POA: Insufficient documentation

## 2017-10-27 DIAGNOSIS — J45909 Unspecified asthma, uncomplicated: Secondary | ICD-10-CM | POA: Insufficient documentation

## 2017-10-27 DIAGNOSIS — K2920 Alcoholic gastritis without bleeding: Secondary | ICD-10-CM | POA: Insufficient documentation

## 2017-10-27 LAB — URINALYSIS, COMPLETE (UACMP) WITH MICROSCOPIC
BILIRUBIN URINE: NEGATIVE
Bacteria, UA: NONE SEEN
GLUCOSE, UA: 50 mg/dL — AB
KETONES UR: 5 mg/dL — AB
LEUKOCYTES UA: NEGATIVE
NITRITE: NEGATIVE
PH: 5 (ref 5.0–8.0)
Protein, ur: NEGATIVE mg/dL
Specific Gravity, Urine: 1.025 (ref 1.005–1.030)
WBC, UA: NONE SEEN WBC/hpf (ref 0–5)

## 2017-10-27 LAB — COMPREHENSIVE METABOLIC PANEL
ALK PHOS: 64 U/L (ref 38–126)
ALT: 23 U/L (ref 0–44)
AST: 32 U/L (ref 15–41)
Albumin: 4.2 g/dL (ref 3.5–5.0)
Anion gap: 9 (ref 5–15)
BUN: 24 mg/dL — ABNORMAL HIGH (ref 6–20)
CALCIUM: 8.8 mg/dL — AB (ref 8.9–10.3)
CO2: 25 mmol/L (ref 22–32)
Chloride: 104 mmol/L (ref 98–111)
Creatinine, Ser: 1.45 mg/dL — ABNORMAL HIGH (ref 0.61–1.24)
GFR, EST NON AFRICAN AMERICAN: 53 mL/min — AB (ref >60)
Glucose, Bld: 98 mg/dL (ref 70–99)
Potassium: 3 mmol/L — ABNORMAL LOW (ref 3.5–5.1)
Sodium: 138 mmol/L (ref 135–145)
Total Bilirubin: 1 mg/dL (ref 0.3–1.2)
Total Protein: 8 g/dL (ref 6.5–8.1)

## 2017-10-27 LAB — CBC
HCT: 40.8 % (ref 40.0–52.0)
Hemoglobin: 14 g/dL (ref 13.0–18.0)
MCH: 31.4 pg (ref 26.0–34.0)
MCHC: 34.4 g/dL (ref 32.0–36.0)
MCV: 91.2 fL (ref 80.0–100.0)
PLATELETS: 211 10*3/uL (ref 150–440)
RBC: 4.47 MIL/uL (ref 4.40–5.90)
RDW: 13.7 % (ref 11.5–14.5)
WBC: 8.5 10*3/uL (ref 3.8–10.6)

## 2017-10-27 LAB — TROPONIN I
Troponin I: <0.03 ng/mL (ref <0.03)
Troponin I: <0.03 ng/mL (ref <0.03)

## 2017-10-27 LAB — LIPASE, BLOOD: LIPASE: 30 U/L (ref 11–51)

## 2017-10-27 MED ORDER — CLONIDINE HCL 0.1 MG PO TABS: 0.1000 mg | ORAL_TABLET | Freq: Two times a day (BID) | ORAL | 2 refills | Status: DC

## 2017-10-27 MED ORDER — ACETAMINOPHEN 500 MG PO TABS
1000.0000 mg | ORAL_TABLET | Freq: Once | ORAL | Status: AC
  Administered 2017-10-27: 1000 mg via ORAL
  Filled 2017-10-27: qty 2

## 2017-10-27 MED ORDER — SODIUM CHLORIDE 0.9 % IV BOLUS
1000.0000 mL | Freq: Once | INTRAVENOUS | Status: AC
  Administered 2017-10-27: 1000 mL via INTRAVENOUS

## 2017-10-27 MED ORDER — GI COCKTAIL ~~LOC~~
30.0000 mL | Freq: Once | ORAL | Status: AC
  Administered 2017-10-27: 30 mL via ORAL
  Filled 2017-10-27: qty 30

## 2017-10-27 MED ORDER — ONDANSETRON HCL 4 MG/2ML IJ SOLN
4.0000 mg | Freq: Once | INTRAMUSCULAR | Status: AC
  Administered 2017-10-27: 4 mg via INTRAVENOUS
  Filled 2017-10-27: qty 2

## 2017-10-27 MED ORDER — HYDRALAZINE HCL 25 MG PO TABS: 25.0000 mg | ORAL_TABLET | Freq: Two times a day (BID) | ORAL | 2 refills | Status: DC

## 2017-10-27 MED ORDER — FAMOTIDINE IN NACL 20-0.9 MG/50ML-% IV SOLN
20.0000 mg | Freq: Once | INTRAVENOUS | Status: AC
  Administered 2017-10-27: 20 mg via INTRAVENOUS
  Filled 2017-10-27: qty 50

## 2017-10-27 MED ORDER — CLONIDINE HCL 0.1 MG PO TABS
0.1000 mg | ORAL_TABLET | Freq: Once | ORAL | Status: AC
  Administered 2017-10-27: 0.1 mg via ORAL

## 2017-10-27 MED ORDER — CLONIDINE HCL 0.1 MG PO TABS
ORAL_TABLET | ORAL | Status: AC
  Administered 2017-10-27: 0.1 mg via ORAL
  Filled 2017-10-27: qty 1

## 2017-10-27 MED ORDER — AMLODIPINE BESYLATE 10 MG PO TABS: 10.0000 mg | ORAL_TABLET | Freq: Every day | ORAL | 2 refills | Status: DC

## 2017-10-27 NOTE — ED Notes (Signed)
Patient transported to XR. 

## 2017-10-27 NOTE — ED Provider Notes (Signed)
[facility] Emergency Department Provider Note  ____________________________________________  Time seen: Approximately 1:17 PM  I have reviewed the triage vital signs and the nursing notes.   HISTORY  Chief Complaint Abdominal Pain   HPI Paul Howard is a 57 y.o. male with history of alcohol abuse, cocaine abuse, hypertension, pancreatitis, and asthma who presents for evaluation of abdominal pain.  Patient reports that he last used cocaine yesterday evening.  He also drank a 1 x 40 ounce beer.  This morning when he woke up he had mild epigastric discomfort which he believed was due to an acute pancreatitis.  He went to work and the pain started to get worse.  The pain became severe, patient became clammy and dizzy.  He left work and was walking home when he decided to call 911.  At this time the pain has improved and is currently 6 out of 10.  He describes the pain as burning, located in the epigastric region and radiating to his back.  The pain has been constant since this morning.  No nausea, vomiting, fever, chills, shortness of breath or chest pain.  Patient denies any history of smoking.  No family history of heart attacks.  Past Medical History:  Diagnosis Date  . Acute epigastric pain 10/22/2014  . AKI (acute kidney injury) ([facility]) 10/22/2014  . Asthma   . HTN (hypertension) 10/22/2014  . Hypertension   . Pancreatitis   . Pancreatitis, alcoholic, acute 09/19/624    Patient Active Problem List   Diagnosis Date Noted  . Acute epigastric pain 10/22/2014  . AKI (acute kidney injury) ([place]) 10/22/2014  . HTN (hypertension) 10/22/2014  . Pancreatitis, alcoholic, acute 94/85/4627    Past Surgical History:  Procedure Laterality Date  . TONSILLECTOMY      Prior to Admission medications   Medication Sig Start Date End Date Taking? Authorizing Provider  cloNIDine (CATAPRES) 0.1 MG tablet Take 1 tablet (0.1 mg total) by mouth 2 (two) times daily. 10/27/17  10/27/18  Rudene Re, MD    Allergies Patient has no known allergies.  Family History  Problem Relation Age of Onset  . Hypertension Mother   . Throat cancer Father     Social History Social History   Tobacco Use  . Smoking status: Never Smoker  . Smokeless tobacco: Never Used  Substance Use Topics  . Alcohol use: Yes    Alcohol/week: 3.0 standard drinks    Types: 3 Cans of beer per week  . Drug use: Yes    Types: Cocaine    Review of Systems  Constitutional: Negative for fever. Eyes: Negative for visual changes. ENT: Negative for sore throat. Neck: No neck pain  Cardiovascular: Negative for chest pain. Respiratory: Negative for shortness of breath. Gastrointestinal: + epigastric abdominal pain. No vomiting or diarrhea. Genitourinary: Negative for dysuria. Musculoskeletal: Negative for back pain. Skin: Negative for rash. Neurological: Negative for headaches, weakness or numbness. Psych: No SI or HI  ____________________________________________   PHYSICAL EXAM:  VITAL SIGNS: ED Triage Vitals [10/27/17 0938]  Enc Vitals Group     BP (!) 186/109     Pulse Rate 78     Resp 18     Temp 98.2 F (36.8 C)     Temp Source Oral     SpO2 99 %     Weight 167 lb (75.8 kg)     Height 5\' 6"  (1.676 m)     Head Circumference      Peak Flow  Pain Score 9     Pain Loc      Pain Edu?      Excl. in [place]?     Constitutional: Alert and oriented. Well appearing and in no apparent distress. HEENT:      Head: Normocephalic and atraumatic.         Eyes: Conjunctivae are normal. Sclera is non-icteric.       Mouth/Throat: Mucous membranes are moist.       Neck: Supple with no signs of meningismus. Cardiovascular: Regular rate and rhythm. No murmurs, gallops, or rubs. 2+ symmetrical distal pulses are present in all extremities. No JVD. Respiratory: Normal respiratory effort. Lungs are clear to auscultation bilaterally. No wheezes, crackles, or rhonchi.    Gastrointestinal: Soft, epigastric tenderness to palpation, and non distended with positive bowel sounds. No rebound or guarding. Musculoskeletal: Nontender with normal range of motion in all extremities. No edema, cyanosis, or erythema of extremities. Neurologic: Normal speech and language. Face is symmetric. Moving all extremities. No gross focal neurologic deficits are appreciated. Skin: Skin is warm, dry and intact. No rash noted. Psychiatric: Mood and affect are normal. Speech and behavior are normal.  ____________________________________________   LABS (all labs ordered are listed, but only abnormal results are displayed)  Labs Reviewed  COMPREHENSIVE METABOLIC PANEL - Abnormal; Notable for the following components:      Result Value   Potassium 3.0 (*)    BUN 24 (*)    Creatinine, Ser 1.45 (*)    Calcium 8.8 (*)    GFR calc non Af Amer 53 (*)    All other components within normal limits  URINALYSIS, COMPLETE (UACMP) WITH MICROSCOPIC - Abnormal; Notable for the following components:   Color, Urine YELLOW (*)    APPearance CLEAR (*)    Glucose, UA 50 (*)    Hgb urine dipstick SMALL (*)    Ketones, ur 5 (*)    All other components within normal limits  LIPASE, BLOOD  CBC  TROPONIN I  TROPONIN I   ____________________________________________  EKG  ED ECG REPORT I, Rudene Re, the attending physician, personally viewed and interpreted this ECG.  Normal sinus rhythm, rate of 80, normal intervals, normal axis, LVH, T wave inversions in inferior and lateral leads, no STE.  Unchanged from prior. ____________________________________________  RADIOLOGY  I have personally reviewed the images performed during this visit and I agree with the Radiologist's read.   Interpretation by Radiologist:  Dg Abdomen Acute W/chest  Result Date: 10/27/2017 CLINICAL DATA:  Abdominal pain EXAM: DG ABDOMEN ACUTE W/ 1V CHEST COMPARISON:  CT abdomen and pelvis October 09, 2016 FINDINGS:  PA chest: Lungs are clear. The heart size and pulmonary vascularity are normal. No adenopathy. Supine and upright abdomen: Calcification at the level of L2 on the right is felt to be due to foci of chronic pancreatitis. There is moderate stool in the colon. There is no bowel dilatation or air-fluid level to suggest bowel obstruction. No free air. 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 IMPRESSION: 1. No bowel obstruction or free air evident. Moderate stool in colon. 2. Calcification in the region of the pancreatic head consistent with chronic pancreatitis. 3.  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 4.  No lung edema or consolidation. Electronically Signed   By: Lowella Grip III M.D.   On: 10/27/2017 13:42      ____________________________________________   PROCEDURES  Procedure(s) performed: None Procedures Critical Care performed:  None ____________________________________________   INITIAL IMPRESSION /  ASSESSMENT AND PLAN / ED COURSE  57 y.o. male with history of alcohol abuse, cocaine abuse, hypertension, pancreatitis, and asthma who presents for evaluation of epigastric abdominal pain.  Patient is well-appearing, no distress, slightly hypertensive but otherwise normal vital signs, neurologically intact with strong pulses in all 4 extremities, abdomen is soft with epigastric tenderness.  Based on patient's presentation and description of his pain my concerns are for alcoholic induced gastritis versus peptic ulcer disease versus pancreatitis.  ACS is also the differential diagnosis especially with cocaine on board.  His EKG shows LVH with no acute ischemic changes and is unchanged from prior.  We will get a troponin.  Will give a GI cocktail, Pepcid, Zofran and fluids for symptom relief.  Labs including CBC, CMP, lipase and troponin.    _________________________ 3:30 PM on 10/27/2017 -----------------------------------------  Labs with no acute findings. XR showing  pancreatic calcifications consistent with chronic pancreatitis. Patient's pain fully resolved with a GI cocktail and Pepcid.  BP elevated. Patient has h/o HTN but does not take medications. Will start patient on clonidine.  Second troponin is pending.  If that is negative plan to discharge home on clonidine.  Alcohol and cocaine cessation counseling provided.  Care transferred to Dr. Cherylann Banas.   As part of my medical decision making, I reviewed the following data within the [facility] notes reviewed and incorporated, Labs reviewed , EKG interpreted , Old EKG reviewed, Old chart reviewed, Radiograph reviewed , Notes from prior ED visits and [place] Controlled Substance Database    Pertinent labs & imaging results that were available during my care of the patient were reviewed by me and considered in my medical decision making (see chart for details).    ____________________________________________   FINAL CLINICAL IMPRESSION(S) / ED DIAGNOSES  Final diagnoses:  Acute alcoholic gastritis without hemorrhage  Hypertension, unspecified type  Epigastric abdominal pain      NEW MEDICATIONS STARTED DURING THIS VISIT:  ED Discharge Orders         Ordered    cloNIDine (CATAPRES) 0.1 MG tablet  2 times daily     10/27/17 1534           Note:  This document was prepared using Dragon voice recognition software and may include unintentional dictation errors.Alfred Levins, [place], MD 10/27/17 1535

## 2017-10-27 NOTE — ED Triage Notes (Signed)
Pt arrived via EMS with reports of upper abd pain since this morning, pt has hx of pancreatitis, states he drank 1 40oz beer yesterday.  Pt denies any N/V/D. Pt states the pain started off slow and progressed to worsening pain.

## 2017-10-27 NOTE — ED Notes (Signed)
Nurse attempted PIV twice. Stanton Kidney or Helene Kelp, RN are going to come and start PIV for this nurse.

## 2017-12-01 ENCOUNTER — Ambulatory Visit: Payer: Self-pay

## 2017-12-13 ENCOUNTER — Ambulatory Visit: Payer: Self-pay | Admitting: Urology

## 2017-12-13 VITALS — BP 167/97 | HR 83 | Temp 98.7°F | Ht 64.0 in | Wt 174.7 lb

## 2017-12-13 DIAGNOSIS — I10 Essential (primary) hypertension: Secondary | ICD-10-CM

## 2017-12-13 NOTE — Progress Notes (Signed)
   Subjective:    Patient ID: Paul Howard, male    DOB: 1961-03-06, 57 y.o.   MRN: [id number]  HPI Paul Howard is a 57 y.o. male who is here today complaining of right LL pain. It started after he was given IV medication for his CT scan when he had pancreatitis 4-5 years ago. He states that it's constant and stable. It is 7-8/10 in severity and not related to movement. It starts at his buttock and radiates to his ankle. He cannot give an accurate description to the quality of the pain, but thinks it's aching in nature. He tried "all day pain relief" medication with minimal relief. He states that he feels the his right LL is weaker and sometimes gets numb and tingles. He states that he has inguinal hernia and has seen a surgeon who requested surgery, but could perform it due to high BP. He noticed the hernia almost 3 years now and is stable in size. He denies nausea, vomiting, hematemesis, hemoptysis, abdominal pain, or blood in stool. He is constipated.  He states that he cut down on alcohol and has had only one beer in the last few days. He stopped cocaine almost a year ago. No smoking. He states that he is compliant with his BP medications.    Review of Systems As above    Objective:   Physical Exam GENERAL:alert, no distress and comfortable SKIN: skin color, texture, turgor are normal, no rashes or significant lesions EYES: normal, conjunctiva are pink and non-injected, sclera clear OROPHARYNX:no exudate, no erythema and lips, buccal mucosa, and tongue normal  NECK: supple, thyroid normal size, non-tender, without nodularity LYMPH:  no palpable lymphadenopathy in the cervical, axillary or inguinal LUNGS: clear to auscultation and percussion with normal breathing effort HEART: regular rate & rhythm and no murmurs and no lower extremity edema ABDOMEN:abdomen soft, non-tender and normal bowel sounds (+) tenderness on right inguinal area. Pelvic exam deferred.  Musculoskeletal:no  cyanosis of digits and no clubbing  PSYCH: alert & oriented x 3 with fluent speech NEURO: no focal motor/sensory deficits MSK: (+) antalgic gait and difficulty applying pressure on the right LL. On inspection, there is no swelling, discoloration, or asymmetry. No pain on palpation. Muscle bulk is similar to the left leg, no wasting. No weakness or sensory loss.       Assessment & Plan:  1. Right LL pain -He cannot use NSAIDS due to recent gastritis. -I advise to use Tylenol as needed,but not excessively due to history of alcohol abuse. His CMP on 8/82019 revealed normal liver enzymes. -I suspect sciatica and will discuss with the team for possible steroid injection and imaging (MRI if possible) to confirm diagnosis. -We will refer to charity care, as [facility] doesn't offer steroid injections or MRI.   2. HTN -I advised him to monitor his BP at home and keep a record. Unfortunately, he doesn't have a BP machine. I will discuss with the team to see if he can be offered one. -I advised him to cut down on alcohol as much as possible, avoid cocaine, and stay compliant with medications, as failure can worsen his BP. -He is currently on amlodipine, clonidine, and hyralazine.

## 2017-12-13 NOTE — Progress Notes (Signed)
° °  Subjective:    Patient ID: Paul Howard, male    DOB: June 30, 1960, 57 y.o.   MRN: [id number]  HPI Paul Howard is a 57 y.o. male who is here today complaining of right LL pain. It started after he was given IV medication for his CT scan when he had pancreatitis 4-5 years ago. He states that it's constant and stable. It is 7-8/10 in severity and not related to movement. It starts at his buttock and radiates to his ankle. He cannot give an accurate description to the quality of the pain, but thinks it's aching in nature. He tried "all day pain relief" medication with minimal relief. He states that he feels the his right LL is weaker and sometimes gets numb and tingles. He states that he has inguinal hernia and has seen a surgeon who requested surgery, but could perform it due to high BP. He noticed the hernia almost 3 years now and is stable in size. He denies nausea, vomiting, hematemesis, hemoptysis, abdominal pain, or blood in stool. He is constipated.  He states that he cut down on alcohol and has had only one beer in the last few days. He stopped cocaine almost a year ago. No smoking. He states that he is compliant with his BP medications.    Review of Systems As above    Objective:   Physical Exam GENERAL:alert, no distress and comfortable SKIN: skin color, texture, turgor are normal, no rashes or significant lesions EYES: normal, conjunctiva are pink and non-injected, sclera clear OROPHARYNX:no exudate, no erythema and lips, buccal mucosa, and tongue normal  NECK: supple, thyroid normal size, non-tender, without nodularity LYMPH:  no palpable lymphadenopathy in the cervical, axillary or inguinal LUNGS: clear to auscultation and percussion with normal breathing effort HEART: regular rate & rhythm and no murmurs and no lower extremity edema ABDOMEN:abdomen soft, non-tender and normal bowel sounds (+) tenderness on right inguinal area. Pelvic exam deferred.  Musculoskeletal:no  cyanosis of digits and no clubbing  PSYCH: alert & oriented x 3 with fluent speech NEURO: no focal motor/sensory deficits MSK: (+) antalgic gait and difficulty applying pressure on the right LL. On inspection, there is no swelling, discoloration, or asymmetry. No pain on palpation. Muscle bulk is similar to the left leg, no wasting. No weakness or sensory loss.       Assessment & Plan:  1. Right LL pain -He cannot use NSAIDS due to recent gastritis. -I advise to use Tylenol as needed,but not excessively due to history of alcohol abuse. His CMP on 8/82019 revealed normal liver enzymes. -I suspect sciatica and will discuss with the team for possible steroid injection and imaging (MRI if possible) to confirm diagnosis. -We will refer to charity care, as [facility] doesn't offer steroid injections or MRI.   2. HTN -I advised him to monitor his BP at home and keep a record. Unfortunately, he doesn't have a BP machine. I will discuss with the team to see if he can be offered one. -I advised him to cut down on alcohol as much as possible, avoid cocaine, and stay compliant with medications, as failure can worsen his BP. -He is currently on amlodipine, clonidine, and hyralazine.

## 2017-12-22 ENCOUNTER — Ambulatory Visit: Payer: Self-pay | Admitting: Ophthalmology

## 2017-12-29 ENCOUNTER — Ambulatory Visit: Payer: Self-pay | Admitting: Adult Health Nurse Practitioner

## 2017-12-29 VITALS — BP 162/83 | HR 88 | Temp 98.2°F | Ht 65.0 in | Wt 177.0 lb

## 2017-12-29 DIAGNOSIS — G8929 Other chronic pain: Secondary | ICD-10-CM

## 2017-12-29 DIAGNOSIS — M549 Dorsalgia, unspecified: Secondary | ICD-10-CM | POA: Insufficient documentation

## 2017-12-29 DIAGNOSIS — I10 Essential (primary) hypertension: Secondary | ICD-10-CM

## 2017-12-29 DIAGNOSIS — M5441 Lumbago with sciatica, right side: Secondary | ICD-10-CM

## 2017-12-29 MED ORDER — CLONIDINE HCL 0.2 MG PO TABS: 0.2000 mg | ORAL_TABLET | Freq: Two times a day (BID) | ORAL | 2 refills | Status: DC

## 2017-12-29 MED ORDER — PREDNISONE 10 MG (21) PO TBPK: ORAL_TABLET | ORAL | 0 refills | Status: DC

## 2017-12-29 NOTE — Progress Notes (Signed)
  Patient: Paul Howard Male    DOB: 1960-12-03   57 y.o.   MRN: [id number] Visit Date: 12/29/2017  Today's Provider: Staci Acosta, NP   Chief Complaint  Patient presents with  . Hypertension  . Leg Pain   Subjective:    HPI   Seen on 9.24 with BP of 167/97, no medication changes at that time- encouraged lifestyle modifications. Supposed to bring BP log. Denies dizziness, HA.   Pt states that his RLL pain with sciatica is worsening. XRAY from 2015 shows: Moderately advanced degenerative disc disease at L4-5 and L5-S1. Has not taken steroid dose pack. Taking OTC medications with no relief.  Having difficulty walking.   No Known Allergies Previous Medications   AMLODIPINE (NORVASC) 10 MG TABLET    Take 1 tablet (10 mg total) by mouth daily.   CLONIDINE (CATAPRES) 0.1 MG TABLET    Take 1 tablet (0.1 mg total) by mouth 2 (two) times daily.   HYDRALAZINE (APRESOLINE) 25 MG TABLET    Take 1 tablet (25 mg total) by mouth 2 (two) times daily.    Review of Systems  Social History   Tobacco Use  . Smoking status: Never Smoker  . Smokeless tobacco: Never Used  Substance Use Topics  . Alcohol use: Yes    Alcohol/week: 3.0 standard drinks    Types: 3 Cans of beer per week   Objective:   BP (!) 162/83   Pulse 88   Temp 98.2 F (36.8 C)   Ht 5\' 5"  (1.651 m)   Wt 177 lb (80.3 kg)   BMI 29.45 kg/m   Physical Exam      Assessment & Plan:         HTN:  Not controlled.  Goal BP <130/80.  Continue current medication regimen and increase Clonidine to 0.2mg  BID.  Continue to keep BP log.   Encourage low salt diet and exercise.   Steroid dose pack.  Refer to Dr. Vickki Hearing for imaging and further POC.    FU in 4 weeks for BP check.  Last K level was 3.0- will order labs for repeat next week.     Staci Acosta, NP   [facility]

## 2018-01-05 ENCOUNTER — Telehealth: Payer: Self-pay

## 2018-01-05 ENCOUNTER — Other Ambulatory Visit: Payer: Self-pay

## 2018-01-05 NOTE — Telephone Encounter (Signed)
Called patient to advise him of the documents still needed for his [facility].  He still needs 3 months of bank statements before I can turn the application in to the Finance Department.

## 2018-01-10 ENCOUNTER — Other Ambulatory Visit: Payer: Self-pay

## 2018-01-10 MED ORDER — PREDNISONE 10 MG (21) PO TBPK: ORAL_TABLET | ORAL | 0 refills | Status: DC

## 2018-01-10 MED ORDER — CLONIDINE HCL 0.2 MG PO TABS: 0.2000 mg | ORAL_TABLET | Freq: Two times a day (BID) | ORAL | 2 refills | Status: DC

## 2018-01-11 ENCOUNTER — Ambulatory Visit: Payer: Self-pay | Admitting: Pharmacy Technician

## 2018-01-11 DIAGNOSIS — Z79899 Other long term (current) drug therapy: Secondary | ICD-10-CM

## 2018-01-12 ENCOUNTER — Other Ambulatory Visit: Payer: Self-pay

## 2018-01-12 DIAGNOSIS — I10 Essential (primary) hypertension: Secondary | ICD-10-CM

## 2018-01-12 DIAGNOSIS — M5441 Lumbago with sciatica, right side: Secondary | ICD-10-CM

## 2018-01-12 DIAGNOSIS — G8929 Other chronic pain: Secondary | ICD-10-CM

## 2018-01-12 NOTE — Progress Notes (Signed)
Completed [facility] application and contract.  Patient agreed to all terms of the [facility] contract.    Patient understands that no additional medications will be dispensed past the initial 30 day fill without this documentation.  Patient to provide last 30 days of paystubs and 2018 taxes.    Provided patient with Civil engineer, contracting based on his particular needs.    [facility]

## 2018-01-13 LAB — COMPREHENSIVE METABOLIC PANEL
ALT: 17 IU/L (ref 0–44)
AST: 19 IU/L (ref 0–40)
Albumin/Globulin Ratio: 1.4 (ref 1.2–2.2)
Albumin: 4.3 g/dL (ref 3.5–5.5)
Alkaline Phosphatase: 63 IU/L (ref 39–117)
BUN/Creatinine Ratio: 17 (ref 9–20)
BUN: 31 mg/dL — ABNORMAL HIGH (ref 6–24)
Bilirubin Total: 0.4 mg/dL (ref 0.0–1.2)
CO2: 21 mmol/L (ref 20–29)
Calcium: 9.5 mg/dL (ref 8.7–10.2)
Chloride: 99 mmol/L (ref 96–106)
Creatinine, Ser: 1.82 mg/dL — ABNORMAL HIGH (ref 0.76–1.27)
GFR calc Af Amer: 47 mL/min/{1.73_m2} — ABNORMAL LOW (ref >59)
GFR calc non Af Amer: 41 mL/min/{1.73_m2} — ABNORMAL LOW (ref >59)
Globulin, Total: 3 g/dL (ref 1.5–4.5)
Glucose: 130 mg/dL — ABNORMAL HIGH (ref 65–99)
Potassium: 4 mmol/L (ref 3.5–5.2)
Sodium: 137 mmol/L (ref 134–144)
Total Protein: 7.3 g/dL (ref 6.0–8.5)

## 2018-01-13 LAB — LIPID PANEL
Chol/HDL Ratio: 3 ratio (ref 0.0–5.0)
Cholesterol, Total: 179 mg/dL (ref 100–199)
HDL: 60 mg/dL (ref >39)
LDL Calculated: 107 mg/dL — ABNORMAL HIGH (ref 0–99)
Triglycerides: 59 mg/dL (ref 0–149)
VLDL Cholesterol Cal: 12 mg/dL (ref 5–40)

## 2018-01-13 LAB — HEMOGLOBIN A1C
Est. average glucose Bld gHb Est-mCnc: 108 mg/dL
Hgb A1c MFr Bld: 5.4 % (ref 4.8–5.6)

## 2018-01-13 LAB — TSH: TSH: 0.987 u[IU]/mL (ref 0.450–4.500)

## 2018-01-18 ENCOUNTER — Ambulatory Visit: Payer: Self-pay | Admitting: Specialist

## 2018-01-25 ENCOUNTER — Other Ambulatory Visit: Payer: Self-pay

## 2018-01-25 ENCOUNTER — Ambulatory Visit: Payer: Self-pay | Admitting: Specialist

## 2018-01-25 MED ORDER — HYDRALAZINE HCL 25 MG PO TABS: 25.0000 mg | ORAL_TABLET | Freq: Two times a day (BID) | ORAL | 2 refills | Status: DC

## 2018-01-25 MED ORDER — AMLODIPINE BESYLATE 10 MG PO TABS: 10.0000 mg | ORAL_TABLET | Freq: Every day | ORAL | 2 refills | Status: DC

## 2018-01-25 MED ORDER — CLONIDINE HCL 0.2 MG PO TABS: 0.2000 mg | ORAL_TABLET | Freq: Two times a day (BID) | ORAL | 2 refills | Status: DC

## 2018-01-26 ENCOUNTER — Ambulatory Visit: Payer: Self-pay

## 2018-02-02 ENCOUNTER — Ambulatory Visit: Payer: Self-pay | Admitting: Adult Health Nurse Practitioner

## 2018-02-02 VITALS — BP 137/84 | HR 75 | Temp 98.6°F | Ht 65.0 in | Wt 180.2 lb

## 2018-02-02 DIAGNOSIS — M5441 Lumbago with sciatica, right side: Secondary | ICD-10-CM

## 2018-02-02 DIAGNOSIS — G8929 Other chronic pain: Secondary | ICD-10-CM

## 2018-02-02 DIAGNOSIS — I10 Essential (primary) hypertension: Secondary | ICD-10-CM

## 2018-02-02 MED ORDER — HYDRALAZINE HCL 25 MG PO TABS: 25.0000 mg | ORAL_TABLET | Freq: Two times a day (BID) | ORAL | 2 refills | Status: DC

## 2018-02-02 MED ORDER — CLONIDINE HCL 0.2 MG PO TABS: 0.2000 mg | ORAL_TABLET | Freq: Two times a day (BID) | ORAL | 2 refills | Status: DC

## 2018-02-02 MED ORDER — AMLODIPINE BESYLATE 10 MG PO TABS: 10.0000 mg | ORAL_TABLET | Freq: Every day | ORAL | 2 refills | Status: DC

## 2018-02-02 NOTE — Progress Notes (Signed)
  Patient: Paul Howard Male    DOB: [birth year]   57 y.o.   MRN: [id number] Visit Date: 02/02/2018  Today's Provider: Staci Acosta, NP   Chief Complaint  Patient presents with  . Leg Pain  . Medication Refill   Subjective:    HPI  Last visit Clonidine was increased to 0.2mg  BID.  BP improved. Currently out of medications.     Stated that the steroid pack helped his pain and his appointment with [facility] was cancelled.   No Known Allergies Previous Medications   AMLODIPINE (NORVASC) 10 MG TABLET    Take 1 tablet (10 mg total) by mouth daily.   CLONIDINE (CATAPRES) 0.2 MG TABLET    Take 1 tablet (0.2 mg total) by mouth 2 (two) times daily.   HYDRALAZINE (APRESOLINE) 25 MG TABLET    Take 1 tablet (25 mg total) by mouth 2 (two) times daily.   PREDNISONE (STERAPRED UNI-PAK 21 TAB) 10 MG (21) TBPK TABLET    6 tabs day 1, 5 day 2, 4 day 3, 3 day 4, 2 day 5 and 1 day 6    Review of Systems  All other systems reviewed and are negative.   Social History   Tobacco Use  . Smoking status: Never Smoker  . Smokeless tobacco: Never Used  Substance Use Topics  . Alcohol use: Yes    Alcohol/week: 3.0 standard drinks    Types: 3 Cans of beer per week   Objective:   BP 137/84   Pulse 75   Temp 98.6 F (37 C)   Ht 5\' 5"  (1.651 m)   Wt 180 lb 3.2 oz (81.7 kg)   BMI 29.99 kg/m   Physical Exam  Constitutional: He appears well-developed and well-nourished.  Cardiovascular: Normal rate and regular rhythm.  Pulmonary/Chest: Effort normal and breath sounds normal.  Abdominal: Soft. Bowel sounds are normal.        Assessment & Plan:     HTN:  Improved. Goal BP <140/90.  Continue current medication regimen.  Encourage low salt diet and exercise.  Repeat BMP next visit as kidney function is slightly bumped.   Make FU with [facility] for back pain.        Staci Acosta, NP   [facility]

## 2018-02-21 ENCOUNTER — Ambulatory Visit: Payer: Self-pay | Admitting: Specialist

## 2018-02-28 ENCOUNTER — Other Ambulatory Visit: Payer: Self-pay

## 2018-02-28 MED ORDER — CLONIDINE HCL 0.2 MG PO TABS: 0.2000 mg | ORAL_TABLET | Freq: Two times a day (BID) | ORAL | 2 refills | Status: DC

## 2018-02-28 MED ORDER — AMLODIPINE BESYLATE 10 MG PO TABS: 10.0000 mg | ORAL_TABLET | Freq: Every day | ORAL | 2 refills | Status: DC

## 2018-02-28 MED ORDER — HYDRALAZINE HCL 25 MG PO TABS: 25.0000 mg | ORAL_TABLET | Freq: Two times a day (BID) | ORAL | 2 refills | Status: DC

## 2018-02-28 NOTE — Progress Notes (Signed)
Patient has not filled out forms for [facility]. Would like 3 BP medications sent to [facility] on [address].

## 2018-03-07 ENCOUNTER — Ambulatory Visit: Payer: Self-pay | Admitting: Specialist

## 2018-03-16 ENCOUNTER — Telehealth: Payer: Self-pay | Admitting: Pharmacy Technician

## 2018-03-16 NOTE — Telephone Encounter (Signed)
Patient failed to return last 30 days of paystubs and 2018 tax return.  No additional medication assistance will be provided until requested financial documentation has been provided to [facility].  Patient notified.  [facility]

## 2018-03-20 ENCOUNTER — Telehealth: Payer: Self-pay

## 2018-03-20 NOTE — Telephone Encounter (Signed)
-----   Message from Paul Howard sent at 03/16/2018 11:57 AM EST ----- Patient failed to provide a month's worth of paystubs and 2018 tax return.  No additional medication assistance will be provided by [facility] until receiving requested financial documentation.  Patient notified.

## 2018-03-28 ENCOUNTER — Ambulatory Visit: Payer: Self-pay | Admitting: Specialist

## 2018-03-28 DIAGNOSIS — M5441 Lumbago with sciatica, right side: Principal | ICD-10-CM

## 2018-03-28 DIAGNOSIS — G8929 Other chronic pain: Secondary | ICD-10-CM

## 2018-03-28 MED ORDER — NAPROXEN 250 MG PO TABS: 500.0000 mg | ORAL_TABLET | Freq: Two times a day (BID) | ORAL | 0 refills | Status: AC

## 2018-03-28 NOTE — Progress Notes (Signed)
  Subjective:     Patient ID: [name], male   DOB: Aug 01, 1960, 58 y.o.   MRN: [id number]  HPI 58 year old states he's had low back and right leg radicular pain for one year, but there is an xray report from 2015 showing moderate degenerate changes. In November, he was on a Medrol dose pack that helped.      Review of Systems       Objective:   Physical Exam   He has a shuffling gait with a slight lurch to the left.  He is able to march in place with normal recruitment and relaxation. He has a negative Trendelenburg sign but has some difficulty balancing on his right side. ROM shows 30 degrees right and left lateral flexion. He has 60 degrees of froward flexion with his fingertips at the level of his knees. He has 10 degrees extension. DTRs 2+ right KJ and AJ and left AJ. Left KJ is absent. Toe signs are downward. SENS. Is normal MMT 5/5 Seated SLR is negative at 90 degrees.    ROM of his right hip is limited and complains of back pain. Assessment:    Low back pain and radicular pain with a question of hip disease.       Plan:    I will try him on a course of Naprosyn and see him back in 3 weeks. If there is any question then I will repeat x-rays of the spine and the hips.

## 2018-04-18 ENCOUNTER — Ambulatory Visit: Payer: Self-pay | Admitting: Specialist

## 2018-05-25 ENCOUNTER — Other Ambulatory Visit: Payer: Self-pay

## 2018-05-30 ENCOUNTER — Other Ambulatory Visit: Payer: Self-pay

## 2018-05-30 DIAGNOSIS — I10 Essential (primary) hypertension: Secondary | ICD-10-CM

## 2018-05-31 LAB — BASIC METABOLIC PANEL
BUN / CREAT RATIO: 12 (ref 9–20)
BUN: 23 mg/dL (ref 6–24)
CHLORIDE: 103 mmol/L (ref 96–106)
CO2: 22 mmol/L (ref 20–29)
Calcium: 9.3 mg/dL (ref 8.7–10.2)
Creatinine, Ser: 1.9 mg/dL — ABNORMAL HIGH (ref 0.76–1.27)
GFR calc Af Amer: 45 mL/min/{1.73_m2} — ABNORMAL LOW (ref >59)
GFR calc non Af Amer: 39 mL/min/{1.73_m2} — ABNORMAL LOW (ref >59)
Glucose: 104 mg/dL — ABNORMAL HIGH (ref 65–99)
Potassium: 3.6 mmol/L (ref 3.5–5.2)
Sodium: 138 mmol/L (ref 134–144)

## 2018-06-01 ENCOUNTER — Other Ambulatory Visit: Payer: Self-pay

## 2018-06-01 ENCOUNTER — Ambulatory Visit: Payer: Self-pay | Admitting: Obstetrics and Gynecology

## 2018-06-01 VITALS — BP 136/85 | HR 74 | Temp 98.6°F | Ht 65.0 in | Wt 180.8 lb

## 2018-06-01 DIAGNOSIS — M5441 Lumbago with sciatica, right side: Principal | ICD-10-CM

## 2018-06-01 DIAGNOSIS — M25551 Pain in right hip: Secondary | ICD-10-CM

## 2018-06-01 DIAGNOSIS — I10 Essential (primary) hypertension: Secondary | ICD-10-CM

## 2018-06-01 DIAGNOSIS — G8929 Other chronic pain: Secondary | ICD-10-CM

## 2018-06-01 MED ORDER — NAPROXEN 500 MG PO TABS: 500.0000 mg | ORAL_TABLET | Freq: Two times a day (BID) | ORAL | 0 refills | Status: DC

## 2018-06-01 MED ORDER — AMLODIPINE BESYLATE 10 MG PO TABS: 10.0000 mg | ORAL_TABLET | Freq: Every day | ORAL | 2 refills | Status: DC

## 2018-06-01 NOTE — Progress Notes (Signed)
Patient, No Pcp Per   Chief Complaint  Patient presents with  . Follow-up    Would like naproxen refill    HPI:      Ms. Paul Howard is a 58 y.o. No obstetric history on file. who LMP was No LMP for male patient., presents today for f/u RT sided low back pain and RT hip pain, radiating down to foot. Has weakness in RT leg with affected gait and decreased ROM (can't bend over), no paresthesias. Sx for several yrs, worse for a few months. No recent trauma/injury. Xrays several yrs ago showed sx due to "age".  Pt given prednisone taper and naprosyn BID 3 wks ago. Sx improved temporarily but have recurred. Xrays indicated.  Hx of HTN, needs amlodipine RF. Recent BMP stable.    Past Medical History:  Diagnosis Date  . Acute epigastric pain 10/22/2014  . AKI (acute kidney injury) ([place]) 10/22/2014  . Asthma   . HTN (hypertension) 10/22/2014  . Hypertension   . Pancreatitis   . Pancreatitis, alcoholic, acute 11/20/4780    Past Surgical History:  Procedure Laterality Date  . TONSILLECTOMY      Family History  Problem Relation Age of Onset  . Hypertension Mother   . Throat cancer Father     Social History   Socioeconomic History  . Marital status: Single    Spouse name: Not on file  . Number of children: Not on file  . Years of education: 47  . Highest education level: 11th grade  Occupational History  . Not on file  Social Needs  . Financial resource strain: Hard  . Food insecurity:    Worry: Often true    Inability: Often true  . Transportation needs:    Medical: No    Non-medical: No  Tobacco Use  . Smoking status: Never Smoker  . Smokeless tobacco: Never Used  Substance and Sexual Activity  . Alcohol use: Yes    Alcohol/week: 3.0 standard drinks    Types: 3 Cans of beer per week  . Drug use: Yes    Types: Cocaine  . Sexual activity: Not on file  Lifestyle  . Physical activity:    Days per week: Not on file    Minutes per session: Not on file  .  Stress: Not on file  Relationships  . Social connections:    Talks on phone: Not on file    Gets together: Not on file    Attends religious service: Not on file    Active member of club or organization: Not on file    Attends meetings of clubs or organizations: Not on file    Relationship status: Not on file  . Intimate partner violence:    Fear of current or ex partner: No    Emotionally abused: No    Physically abused: No    Forced sexual activity: No  Other Topics Concern  . Not on file  Social History Narrative   Not on food stamps, hasn't applied. No transportation. Walks everywhere. Close to work, but far from paying rent and grocery.    Outpatient Medications Prior to Visit  Medication Sig Dispense Refill  . cloNIDine (CATAPRES) 0.2 MG tablet Take 1 tablet (0.2 mg total) by mouth 2 (two) times daily. 60 tablet 2  . hydrALAZINE (APRESOLINE) 25 MG tablet Take 1 tablet (25 mg total) by mouth 2 (two) times daily. 60 tablet 2  . amLODipine (NORVASC) 10 MG tablet  Take 1 tablet (10 mg total) by mouth daily. 30 tablet 2  . predniSONE (STERAPRED UNI-PAK 21 TAB) 10 MG (21) TBPK tablet 6 tabs day 1, 5 day 2, 4 day 3, 3 day 4, 2 day 5 and 1 day 6 (Patient not taking: Reported on 06/01/2018) 21 tablet 0   No facility-administered medications prior to visit.       ROS:  Review of Systems  Musculoskeletal: Positive for arthralgias, back pain, gait problem and myalgias.     OBJECTIVE:   Vitals:  BP 136/85 (BP Location: Right Arm, Patient Position: Sitting, Cuff Size: Normal)   Pulse 74   Temp 98.6 F (37 C) (Oral)   Ht 5\' 5"  (1.651 m)   Wt 180 lb 12.8 oz (82 kg)   BMI 30.09 kg/m   Physical Exam Musculoskeletal:        General: Deformity present.     Lumbar back: He exhibits decreased range of motion, pain and spasm.  Neurological:     Mental Status: He is alert.     Assessment/Plan: Chronic bilateral low back pain with right-sided sciatica - Check xrays. Rx RF  naprosyn. Heat/ice pack - Plan: DG Lumbar Spine Complete, DG Arthro Hip Right, naproxen (NAPROSYN) 500 MG tablet  Right hip pain - Plan: DG Lumbar Spine Complete, DG Arthro Hip Right, naproxen (NAPROSYN) 500 MG tablet  Essential hypertension - Rx RF amlodipine for 3 months. Recent BMI WNl - Plan: amLODipine (NORVASC) 10 MG tablet    Meds ordered this encounter  Medications  . naproxen (NAPROSYN) 500 MG tablet    Sig: Take 1 tablet (500 mg total) by mouth 2 (two) times daily with a meal. As needed for pain    Dispense:  30 tablet    Refill:  0    Order Specific Question:   Supervising Provider    Answer:   Gae Dry [id number]  . amLODipine (NORVASC) 10 MG tablet    Sig: Take 1 tablet (10 mg total) by mouth daily.    Dispense:  30 tablet    Refill:  2    Order Specific Question:   Supervising Provider    Answer:   Gae Dry [[id number]]      Return in about 1 week (around 06/08/2018) for appt wk after xray.   B. , PA-C 06/01/2018 6:49 PM

## 2018-06-01 NOTE — Patient Instructions (Signed)
I value your feedback and entrusting us with your care. If you get a [facility] patient survey, I would appreciate you taking the time to let us know about your experience today. Thank you! 

## 2018-06-07 ENCOUNTER — Encounter (INDEPENDENT_AMBULATORY_CARE_PROVIDER_SITE_OTHER): Payer: Self-pay

## 2018-06-08 ENCOUNTER — Ambulatory Visit: Payer: Self-pay

## 2018-06-15 ENCOUNTER — Ambulatory Visit: Payer: Self-pay

## 2018-06-15 ENCOUNTER — Other Ambulatory Visit: Payer: Self-pay

## 2018-06-20 ENCOUNTER — Other Ambulatory Visit: Payer: Self-pay

## 2018-06-20 DIAGNOSIS — I10 Essential (primary) hypertension: Secondary | ICD-10-CM

## 2018-06-20 DIAGNOSIS — M5441 Lumbago with sciatica, right side: Secondary | ICD-10-CM

## 2018-06-20 DIAGNOSIS — G8929 Other chronic pain: Secondary | ICD-10-CM

## 2018-06-20 DIAGNOSIS — M25551 Pain in right hip: Secondary | ICD-10-CM

## 2018-06-20 MED ORDER — HYDRALAZINE HCL 25 MG PO TABS: 25.0000 mg | ORAL_TABLET | Freq: Two times a day (BID) | ORAL | 2 refills | Status: DC

## 2018-06-20 MED ORDER — CLONIDINE HCL 0.2 MG PO TABS: 0.2000 mg | ORAL_TABLET | Freq: Two times a day (BID) | ORAL | 2 refills | Status: DC

## 2018-06-20 MED ORDER — NAPROXEN 500 MG PO TABS: 500.0000 mg | ORAL_TABLET | Freq: Two times a day (BID) | ORAL | 0 refills | Status: DC

## 2018-06-20 MED ORDER — AMLODIPINE BESYLATE 10 MG PO TABS: 10.0000 mg | ORAL_TABLET | Freq: Every day | ORAL | 2 refills | Status: DC

## 2018-07-21 ENCOUNTER — Other Ambulatory Visit: Payer: Self-pay

## 2018-07-21 ENCOUNTER — Ambulatory Visit
Admission: RE | Admit: 2018-07-21 | Discharge: 2018-07-21 | Disposition: A | Discharge status: Home / Self Care | Payer: Self-pay | Source: Ambulatory Visit | Attending: Obstetrics and Gynecology | Admitting: Obstetrics and Gynecology

## 2018-07-21 ENCOUNTER — Ambulatory Visit
Admission: RE | Admit: 2018-07-21 | Discharge: 2018-07-21 | Disposition: A | Discharge status: Home / Self Care | Payer: Self-pay | Attending: Obstetrics and Gynecology | Admitting: Obstetrics and Gynecology

## 2018-07-21 DIAGNOSIS — G8929 Other chronic pain: Secondary | ICD-10-CM

## 2018-07-21 DIAGNOSIS — M25551 Pain in right hip: Secondary | ICD-10-CM

## 2018-07-21 DIAGNOSIS — M5441 Lumbago with sciatica, right side: Secondary | ICD-10-CM | POA: Insufficient documentation

## 2018-07-27 ENCOUNTER — Ambulatory Visit: Payer: Self-pay | Admitting: Urology

## 2018-07-27 ENCOUNTER — Other Ambulatory Visit: Payer: Self-pay

## 2018-07-27 ENCOUNTER — Encounter: Payer: Self-pay | Admitting: Urology

## 2018-07-27 DIAGNOSIS — G8929 Other chronic pain: Secondary | ICD-10-CM

## 2018-07-27 DIAGNOSIS — M25551 Pain in right hip: Secondary | ICD-10-CM

## 2018-07-27 DIAGNOSIS — M5441 Lumbago with sciatica, right side: Principal | ICD-10-CM

## 2018-07-27 MED ORDER — NAPROXEN 500 MG PO TABS: 500.0000 mg | ORAL_TABLET | Freq: Two times a day (BID) | ORAL | 0 refills | Status: DC

## 2018-07-27 NOTE — Progress Notes (Signed)
Virtual Visit via Telephone Note  I connected with Paul Howard on 07/27/2018 at 1810 by telephone and verified that I am speaking with the correct person using two identifiers.  They are located at home.   I am located at my home.    This visit type was conducted due to national recommendations for restrictions regarding the COVID-19 Pandemic (e.g. social distancing).  This format is felt to be most appropriate for this patient at this time.  All issues noted in this document were discussed and addressed.  No physical exam was performed.   I discussed the limitations, risks, security and privacy concerns of performing an evaluation and management service by telephone and the availability of in person appointments. I also discussed with the patient that there may be a patient responsible charge related to this service. The patient expressed understanding and agreed to proceed.   History of Present Illness: Paul Howard is a 58 year old male with hx of low back pain and hip pain.  He recently was seen and had x-rays ordered of his lumbar region and hips.  Hip x-rays and lumbar x-rays on 07/21/2018 revealed moderate to severe osteoarthritic change of the hips, unchanged from previous images.        Observations/Objective: CLINICAL DATA:  Chronic low back pain and right sciatica and right hip pain.  EXAM: DG HIP (WITH OR WITHOUT PELVIS) 2-3V RIGHT  COMPARISON:  [facility] 10/27/2017  FINDINGS: Moderate to severe osteoarthritic change of the hips without significant change. Loose body versus hypertrophic bone adjacent the lateral aspect of the right femoral neck unchanged. No acute fracture or dislocation. Degenerative change of the spine and sacroiliac joints.  IMPRESSION: No acute findings.  Moderate to severe osteoarthritic change of the hips unchanged.   Electronically Signed   By: Marin Olp M.D.   On: 07/21/2018 10:09  CLINICAL DATA:  Chronic low back pain and right-sided  sciatica.  EXAM: LUMBAR SPINE - COMPLETE 4+ VIEW  COMPARISON:  Abdominal films 10/27/2017 and CT 10/09/2016  FINDINGS: Vertebral body alignment and heights are normal. There is mild to moderate spondylosis of the lumbar spine to include facet arthropathy over the lower lumbar spine. Moderate disc space narrowing at the L4-5 and L5-S1 levels unchanged. No evidence of compression fracture or spondylolisthesis. Moderate to severe osteoarthritic change of the hips. Mild degenerative change of the inferior sacroiliac joints.  IMPRESSION: Mild to moderate spondylosis of the lumbar spine with moderate disc disease at the L4-5 and L5-S1 levels unchanged. No acute findings.  Moderate to severe bilateral osteoarthritic change of the hips. Mild degenerative change of the sacroiliac joints.   Electronically Signed   By: Marin Olp M.D.   On: 07/21/2018 10:06   Assessment and Plan: 1. Bilateral hip pain - severe bilateral osteoarthritic changes of the hip - apply for [facility] and refer to [facility] for orthopedics  2. Lower back pain - Moderate spondylosis and disc space narrowing - apply for [facility] and refer to [facility] for orthopedics  3. HTN - follow up in three months in office    Follow Up Instructions:  Refer to [facility] orthopedics.  I discussed the assessment and treatment plan with the patient. The patient was provided an opportunity to ask questions and all were answered. The patient agreed with the plan and demonstrated an understanding of the instructions.   The patient was advised to call back or seek an in-person evaluation if the symptoms worsen or if the condition fails  to improve as anticipated.  I provided 10 minutes of non-face-to-face time during this encounter.   Loomis Anacker, PA-C

## 2018-08-31 ENCOUNTER — Other Ambulatory Visit: Payer: Self-pay

## 2018-08-31 DIAGNOSIS — G8929 Other chronic pain: Secondary | ICD-10-CM

## 2018-08-31 DIAGNOSIS — M25551 Pain in right hip: Secondary | ICD-10-CM

## 2018-08-31 NOTE — Telephone Encounter (Signed)
Requesting refill on naproxen.  Pharmacy is [facility].

## 2018-09-04 MED ORDER — NAPROXEN 500 MG PO TABS: 500.0000 mg | ORAL_TABLET | Freq: Two times a day (BID) | ORAL | 0 refills | Status: DC

## 2018-09-19 ENCOUNTER — Other Ambulatory Visit: Payer: Self-pay

## 2018-09-19 DIAGNOSIS — I10 Essential (primary) hypertension: Secondary | ICD-10-CM

## 2018-09-19 MED ORDER — AMLODIPINE BESYLATE 10 MG PO TABS: 10.0000 mg | ORAL_TABLET | Freq: Every day | ORAL | 2 refills | Status: DC

## 2018-09-19 MED ORDER — HYDRALAZINE HCL 25 MG PO TABS: 25.0000 mg | ORAL_TABLET | Freq: Two times a day (BID) | ORAL | 2 refills | Status: DC

## 2018-09-19 MED ORDER — CLONIDINE HCL 0.2 MG PO TABS: 0.2000 mg | ORAL_TABLET | Freq: Two times a day (BID) | ORAL | 2 refills | Status: DC

## 2018-10-26 ENCOUNTER — Other Ambulatory Visit: Payer: Self-pay

## 2018-10-26 ENCOUNTER — Ambulatory Visit: Payer: Self-pay | Admitting: Urology

## 2018-10-26 VITALS — BP 145/90 | HR 98 | Temp 98.6°F | Ht 66.0 in | Wt 184.0 lb

## 2018-10-26 DIAGNOSIS — N138 Other obstructive and reflux uropathy: Secondary | ICD-10-CM

## 2018-10-26 DIAGNOSIS — M25551 Pain in right hip: Secondary | ICD-10-CM

## 2018-10-26 DIAGNOSIS — I10 Essential (primary) hypertension: Secondary | ICD-10-CM

## 2018-10-26 DIAGNOSIS — G8929 Other chronic pain: Secondary | ICD-10-CM

## 2018-10-26 DIAGNOSIS — N181 Chronic kidney disease, stage 1: Secondary | ICD-10-CM

## 2018-10-26 MED ORDER — CLONIDINE HCL 0.2 MG PO TABS: 0.2000 mg | ORAL_TABLET | Freq: Two times a day (BID) | ORAL | 2 refills | Status: DC

## 2018-10-26 MED ORDER — AMLODIPINE BESYLATE 10 MG PO TABS: 10.0000 mg | ORAL_TABLET | Freq: Every day | ORAL | 2 refills | Status: DC

## 2018-10-26 MED ORDER — NAPROXEN 500 MG PO TABS: 500.0000 mg | ORAL_TABLET | Freq: Two times a day (BID) | ORAL | 0 refills | Status: DC

## 2018-10-26 MED ORDER — HYDRALAZINE HCL 25 MG PO TABS: 25.0000 mg | ORAL_TABLET | Freq: Two times a day (BID) | ORAL | 2 refills | Status: DC

## 2018-10-26 NOTE — Progress Notes (Signed)
  Patient: Paul Howard Male    DOB: [birth year]   58 y.o.   MRN: [id number] Visit Date: 10/26/2018  Today's Provider: [facility]   Chief Complaint  Patient presents with  . Arthritis    right   Subjective:    HPI Still with hip and back pain - did not fill out [facility] forms  CKD - recheck creatinine tonight - check RUS - check PSA - as patient complains of frequency and nocturia - may be obstructive     No Known Allergies Previous Medications   AMLODIPINE (NORVASC) 10 MG TABLET    Take 1 tablet (10 mg total) by mouth daily.   CLONIDINE (CATAPRES) 0.2 MG TABLET    Take 1 tablet (0.2 mg total) by mouth 2 (two) times daily.   HYDRALAZINE (APRESOLINE) 25 MG TABLET    Take 1 tablet (25 mg total) by mouth 2 (two) times daily.   NAPROXEN (NAPROSYN) 500 MG TABLET    Take 1 tablet (500 mg total) by mouth 2 (two) times daily with a meal. As needed for pain   PREDNISONE (STERAPRED UNI-PAK 21 TAB) 10 MG (21) TBPK TABLET    6 tabs day 1, 5 day 2, 4 day 3, 3 day 4, 2 day 5 and 1 day 6    Review of Systems  Social History   Tobacco Use  . Smoking status: Never Smoker  . Smokeless tobacco: Never Used  Substance Use Topics  . Alcohol use: Yes    Alcohol/week: 3.0 standard drinks    Types: 3 Cans of beer per week   Objective:   BP (!) 145/90   Pulse 98   Temp 98.6 F (37 C)   Ht 5\' 6"  (1.676 m)   Wt 184 lb (83.5 kg)   BMI 29.70 kg/m   Physical Exam      Assessment & Plan:   1. Bilateral hip pain  - needs to fill out [facility] forms for referral to [facility] for orthopedics   2. Lower back pain   - see # 1   3. HTN   - at goal  - meds refilled   4. CKD  - recheck creatinine tonight   - RUS as patient complains of urinary frequency and nocturia - may have obstruction   - check PSA      [facility]

## 2018-10-27 LAB — BUN+CREAT
BUN/Creatinine Ratio: 14 (ref 9–20)
BUN: 29 mg/dL — ABNORMAL HIGH (ref 6–24)
Creatinine, Ser: 2.01 mg/dL — ABNORMAL HIGH (ref 0.76–1.27)
GFR calc Af Amer: 42 mL/min/1.73 — ABNORMAL LOW
GFR calc non Af Amer: 36 mL/min/1.73 — ABNORMAL LOW

## 2018-10-27 LAB — PSA: Prostate Specific Ag, Serum: 2.7 ng/mL (ref 0.0–4.0)

## 2018-11-02 ENCOUNTER — Other Ambulatory Visit: Payer: Self-pay

## 2018-11-02 DIAGNOSIS — N181 Chronic kidney disease, stage 1: Secondary | ICD-10-CM

## 2018-12-13 DIAGNOSIS — N179 Acute kidney failure, unspecified: Secondary | ICD-10-CM | POA: Insufficient documentation

## 2018-12-13 DIAGNOSIS — N1831 Chronic kidney disease, stage 3a: Secondary | ICD-10-CM | POA: Insufficient documentation

## 2018-12-13 DIAGNOSIS — N1832 Chronic kidney disease, stage 3b: Secondary | ICD-10-CM | POA: Insufficient documentation

## 2018-12-26 ENCOUNTER — Other Ambulatory Visit: Payer: Self-pay

## 2018-12-26 DIAGNOSIS — N181 Chronic kidney disease, stage 1: Secondary | ICD-10-CM

## 2018-12-27 ENCOUNTER — Other Ambulatory Visit: Payer: Self-pay

## 2018-12-27 DIAGNOSIS — M25551 Pain in right hip: Secondary | ICD-10-CM

## 2018-12-27 DIAGNOSIS — N181 Chronic kidney disease, stage 1: Secondary | ICD-10-CM

## 2018-12-27 DIAGNOSIS — I10 Essential (primary) hypertension: Secondary | ICD-10-CM

## 2018-12-27 DIAGNOSIS — G8929 Other chronic pain: Secondary | ICD-10-CM

## 2018-12-27 DIAGNOSIS — M5441 Lumbago with sciatica, right side: Secondary | ICD-10-CM

## 2018-12-27 MED ORDER — NAPROXEN 500 MG PO TABS: 500.0000 mg | ORAL_TABLET | Freq: Two times a day (BID) | ORAL | 0 refills | Status: DC

## 2018-12-27 MED ORDER — CLONIDINE HCL 0.2 MG PO TABS: 0.2000 mg | ORAL_TABLET | Freq: Two times a day (BID) | ORAL | 0 refills | Status: DC

## 2018-12-27 MED ORDER — HYDRALAZINE HCL 25 MG PO TABS: 25.0000 mg | ORAL_TABLET | Freq: Two times a day (BID) | ORAL | 0 refills | Status: DC

## 2018-12-27 MED ORDER — AMLODIPINE BESYLATE 10 MG PO TABS: 10.0000 mg | ORAL_TABLET | Freq: Every day | ORAL | 0 refills | Status: DC

## 2018-12-28 ENCOUNTER — Ambulatory Visit: Payer: Self-pay | Admitting: Ophthalmology

## 2018-12-28 ENCOUNTER — Other Ambulatory Visit: Payer: Self-pay

## 2018-12-28 DIAGNOSIS — N181 Chronic kidney disease, stage 1: Secondary | ICD-10-CM

## 2018-12-29 ENCOUNTER — Ambulatory Visit: Payer: Self-pay

## 2019-01-02 LAB — PROTEIN ELECTROPHORESIS, URINE REFLEX
Albumin ELP, Urine: 24 %
Alpha-1-Globulin, U: 3.8 %
Alpha-2-Globulin, U: 14.5 %
Beta Globulin, U: 29.3 %
Gamma Globulin, U: 28.4 %
Protein, Ur: 18 mg/dL

## 2019-01-02 LAB — CBC WITH DIFFERENTIAL/PLATELET
Basophils Absolute: 0 10*3/uL (ref 0.0–0.2)
Basos: 1 %
EOS (ABSOLUTE): 0.2 10*3/uL (ref 0.0–0.4)
Eos: 3 %
Hematocrit: 37 % — ABNORMAL LOW (ref 37.5–51.0)
Hemoglobin: 12.6 g/dL — ABNORMAL LOW (ref 13.0–17.7)
Immature Grans (Abs): 0 10*3/uL (ref 0.0–0.1)
Immature Granulocytes: 0 %
Lymphocytes Absolute: 2.6 10*3/uL (ref 0.7–3.1)
Lymphs: 38 %
MCH: 30.8 pg (ref 26.6–33.0)
MCHC: 34.1 g/dL (ref 31.5–35.7)
MCV: 91 fL (ref 79–97)
Monocytes Absolute: 0.8 10*3/uL (ref 0.1–0.9)
Monocytes: 12 %
Neutrophils Absolute: 3.2 10*3/uL (ref 1.4–7.0)
Neutrophils: 46 %
Platelets: 269 10*3/uL (ref 150–450)
RBC: 4.09 x10E6/uL — ABNORMAL LOW (ref 4.14–5.80)
RDW: 13 % (ref 11.6–15.4)
WBC: 6.9 10*3/uL (ref 3.4–10.8)

## 2019-01-02 LAB — RENAL FUNCTION PANEL
Albumin: 4.2 g/dL (ref 3.8–4.9)
BUN/Creatinine Ratio: 12 (ref 9–20)
BUN: 25 mg/dL — ABNORMAL HIGH (ref 6–24)
CO2: 22 mmol/L (ref 20–29)
Calcium: 9.5 mg/dL (ref 8.7–10.2)
Chloride: 104 mmol/L (ref 96–106)
Creatinine, Ser: 2.1 mg/dL — ABNORMAL HIGH (ref 0.76–1.27)
GFR calc Af Amer: 39 mL/min/{1.73_m2} — ABNORMAL LOW (ref >59)
GFR calc non Af Amer: 34 mL/min/{1.73_m2} — ABNORMAL LOW (ref >59)
Glucose: 109 mg/dL — ABNORMAL HIGH (ref 65–99)
Phosphorus: 4.4 mg/dL — ABNORMAL HIGH (ref 2.8–4.1)
Potassium: 3.6 mmol/L (ref 3.5–5.2)
Sodium: 141 mmol/L (ref 134–144)

## 2019-01-02 LAB — PROTEIN ELECTROPHORESIS, SERUM
A/G Ratio: 1.2 (ref 0.7–1.7)
Albumin ELP: 3.9 g/dL (ref 2.9–4.4)
Alpha 1: 0.1 g/dL (ref 0.0–0.4)
Alpha 2: 0.9 g/dL (ref 0.4–1.0)
Beta: 0.9 g/dL (ref 0.7–1.3)
Gamma Globulin: 1.4 g/dL (ref 0.4–1.8)
Globulin, Total: 3.3 g/dL (ref 2.2–3.9)
Total Protein: 7.2 g/dL (ref 6.0–8.5)

## 2019-01-02 LAB — MAGNESIUM: Magnesium: 2.1 mg/dL (ref 1.6–2.3)

## 2019-01-02 LAB — PTH, INTACT AND CALCIUM: PTH: 41 pg/mL (ref 15–65)

## 2019-01-02 LAB — HEPATITIS B SURFACE ANTIBODY,QUALITATIVE: Hep B Surface Ab, Qual: NONREACTIVE

## 2019-01-02 LAB — HEPATITIS C ANTIBODY: Hep C Virus Ab: 0.1 s/co ratio (ref 0.0–0.9)

## 2019-02-01 ENCOUNTER — Other Ambulatory Visit: Payer: Self-pay

## 2019-02-01 ENCOUNTER — Ambulatory Visit: Payer: Self-pay | Admitting: Gerontology

## 2019-02-01 VITALS — BP 172/109 | HR 103 | Temp 98.2°F | Ht 66.0 in | Wt 185.0 lb

## 2019-02-01 DIAGNOSIS — I10 Essential (primary) hypertension: Secondary | ICD-10-CM

## 2019-02-01 DIAGNOSIS — M25551 Pain in right hip: Secondary | ICD-10-CM | POA: Insufficient documentation

## 2019-02-01 MED ORDER — AMLODIPINE BESYLATE 10 MG PO TABS: 10.0000 mg | ORAL_TABLET | Freq: Every day | ORAL | 2 refills | Status: DC

## 2019-02-01 MED ORDER — BLOOD PRESSURE KIT: 1.0000 | PACK | Freq: Every day | 0 refills | Status: AC

## 2019-02-01 MED ORDER — CLONIDINE HCL 0.2 MG PO TABS: 0.2000 mg | ORAL_TABLET | Freq: Two times a day (BID) | ORAL | 2 refills | Status: DC

## 2019-02-01 MED ORDER — HYDRALAZINE HCL 25 MG PO TABS: 25.0000 mg | ORAL_TABLET | Freq: Two times a day (BID) | ORAL | 3 refills | Status: DC

## 2019-02-01 MED ORDER — LOSARTAN POTASSIUM 50 MG PO TABS: 25.0000 mg | ORAL_TABLET | Freq: Every day | ORAL | 0 refills | Status: DC

## 2019-02-01 NOTE — Progress Notes (Signed)
Established Patient Office Visit  Subjective:  Patient ID: Paul Howard, male    DOB: Nov 10, 1960  Age: 58 y.o. MRN: [id number]  CC:  Chief Complaint  Patient presents with  . Follow-up    HPI Paul Howard presents for follow up of uncontrolled hypertension and worsening bilateral hip pain. He states that he's compliant with his medication and was out of Clonidine yesterday. He states that he doesn't have a blood pressure machine. He was seen at [facility] on 01/15/2019 by Dr Neta Mends. He has stage 3 CKD, and he was advised  to avoid NSAID and will follow up on 07/16/2019. He reports that he drinks 40 oz beer every other day, and continues to work on his diet. He denies chest pain, palpitation, light headedness and peripheral edema. He currently reports that his bilateral hip pain is worsening, he has a waddle gait. He states that he experiences constant non radiating sharp pain especially when walking and he can't take NSAID because of CKD. He states that he's doing well and offers no further complaint.  Past Medical History:  Diagnosis Date  . Acute epigastric pain 10/22/2014  . AKI (acute kidney injury) ([place]) 10/22/2014  . Asthma   . HTN (hypertension) 10/22/2014  . Hypertension   . Pancreatitis   . Pancreatitis, alcoholic, acute 04/26/537    Past Surgical History:  Procedure Laterality Date  . TONSILLECTOMY      Family History  Problem Relation Age of Onset  . Hypertension Mother   . Throat cancer Father     Social History   Socioeconomic History  . Marital status: Single    Spouse name: Not on file  . Number of children: Not on file  . Years of education: 55  . Highest education level: 11th grade  Occupational History  . Not on file  Social Needs  . Financial resource strain: Hard  . Food insecurity    Worry: Often true    Inability: Often true  . Transportation needs    Medical: No    Non-medical: No  Tobacco Use  . Smoking status:  Never Smoker  . Smokeless tobacco: Never Used  Substance and Sexual Activity  . Alcohol use: Yes    Alcohol/week: 3.0 standard drinks    Types: 3 Cans of beer per week  . Drug use: Not Currently    Types: Cocaine  . Sexual activity: Not on file  Lifestyle  . Physical activity    Days per week: 2 days    Minutes per session: 20 min  . Stress: Not at all  Relationships  . Social Herbalist on phone: Twice a week    Gets together: Twice a week    Attends religious service: Never    Active member of club or organization: No    Attends meetings of clubs or organizations: Never    Relationship status: Not on file  . Intimate partner violence    Fear of current or ex partner: No    Emotionally abused: No    Physically abused: No    Forced sexual activity: No  Other Topics Concern  . Not on file  Social History Narrative   Not on food stamps, hasn't applied. No transportation. Walks everywhere. Close to work, but far from paying rent and grocery.    Outpatient Medications Prior to Visit  Medication Sig Dispense Refill  . amLODipine (NORVASC) 10 MG tablet Take 1 tablet (10 mg total)  by mouth daily. 30 tablet 0  . cloNIDine (CATAPRES) 0.2 MG tablet Take 1 tablet (0.2 mg total) by mouth 2 (two) times daily. 60 tablet 0  . hydrALAZINE (APRESOLINE) 25 MG tablet Take 1 tablet (25 mg total) by mouth 2 (two) times daily. 60 tablet 0  . naproxen (NAPROSYN) 500 MG tablet Take 1 tablet (500 mg total) by mouth 2 (two) times daily with a meal. As needed for pain (Patient not taking: Reported on 02/01/2019) 30 tablet 0  . predniSONE (STERAPRED UNI-PAK 21 TAB) 10 MG (21) TBPK tablet 6 tabs day 1, 5 day 2, 4 day 3, 3 day 4, 2 day 5 and 1 day 6 (Patient not taking: Reported on 06/01/2018) 21 tablet 0   No facility-administered medications prior to visit.     No Known Allergies  ROS Review of Systems  Constitutional: Negative.   Eyes: Negative.   Respiratory: Negative.    Cardiovascular: Negative.   Musculoskeletal: Positive for arthralgias (chronic pain to bilateral hip).  Skin: Negative.   Neurological: Negative.   Psychiatric/Behavioral: Negative.       Objective:    Physical Exam  Constitutional: He is oriented to person, place, and time. He appears well-developed.  HENT:  Head: Normocephalic and atraumatic.  Eyes: Pupils are equal, round, and reactive to light. EOM are normal.  Cardiovascular: Normal rate and regular rhythm.  Pulmonary/Chest: Effort normal and breath sounds normal.  Musculoskeletal:     Right hip: He exhibits decreased range of motion (Waddle gait).     Left hip: He exhibits decreased range of motion.  Neurological: He is alert and oriented to person, place, and time.  Skin: Skin is warm and dry.  Psychiatric: He has a normal mood and affect. His behavior is normal. Judgment and thought content normal.    BP (!) 172/109 (BP Location: Right Arm, Patient Position: Sitting, Cuff Size: Large)   Pulse (!) 103   Temp 98.2 F (36.8 C)   Ht '5\' 6"'  (1.676 m)   Wt 185 lb (83.9 kg)   SpO2 96%   BMI 29.86 kg/m  Wt Readings from Last 3 Encounters:  02/01/19 185 lb (83.9 kg)  10/26/18 184 lb (83.5 kg)  06/01/18 180 lb 12.8 oz (82 kg)     Health Maintenance Due  Topic Date Due  . HIV Screening  12/07/1976  . TETANUS/TDAP  12/07/1980  . COLONOSCOPY  12/08/2011  . INFLUENZA VACCINE  10/21/2018    There are no preventive care reminders to display for this patient.  Lab Results  Component Value Date   TSH 0.987 01/12/2018   Lab Results  Component Value Date   WBC 6.9 12/28/2018   HGB 12.6 (L) 12/28/2018   HCT 37.0 (L) 12/28/2018   MCV 91 12/28/2018   PLT 269 12/28/2018   Lab Results  Component Value Date   NA 141 02/01/2019   K 3.7 02/01/2019   CO2 24 02/01/2019   GLUCOSE 108 (H) 02/01/2019   BUN 26 (H) 02/01/2019   CREATININE 2.24 (H) 02/01/2019   BILITOT 0.4 01/12/2018   ALKPHOS 63 01/12/2018   AST 19  01/12/2018   ALT 17 01/12/2018   PROT 7.2 12/28/2018   ALBUMIN 4.2 12/28/2018   CALCIUM 9.7 02/01/2019   ANIONGAP 9 10/27/2017   Lab Results  Component Value Date   CHOL 179 01/12/2018   Lab Results  Component Value Date   HDL 60 01/12/2018   Lab Results  Component Value Date   LDLCALC  107 (H) 01/12/2018   Lab Results  Component Value Date   TRIG 59 01/12/2018   Lab Results  Component Value Date   CHOLHDL 3.0 01/12/2018   Lab Results  Component Value Date   HGBA1C 5.4 01/12/2018      Assessment & Plan:   1. Essential hypertension -His blood pressure is not controlled likely due to non adherence to DASH diet, he works at a [employer]. He was strongly advised to continue on low salt diet, and increase water intake. His goal blood pressure is < 140/90. Losartan 25 mg was added to protect his kidney. He was educated on medication side effect and advised to notify clinic. He was provided with a blood pressure machine, was advised to check and record Bp and bring log to his follow up visit in 2 weeks. - amLODipine (NORVASC) 10 MG tablet; Take 1 tablet (10 mg total) by mouth daily.  Dispense: 30 tablet; Refill: 2 - cloNIDine (CATAPRES) 0.2 MG tablet; Take 1 tablet (0.2 mg total) by mouth 2 (two) times daily.  Dispense: 60 tablet; Refill: 2 - hydrALAZINE (APRESOLINE) 25 MG tablet; Take 1 tablet (25 mg total) by mouth 2 (two) times daily.  Dispense: 60 tablet; Refill: 3 - Blood Pressure KIT; 1 kit by Does not apply route daily.  Dispense: 1 kit; Refill: 0 - losartan (COZAAR) 50 MG tablet; Take 0.5 tablets (25 mg total) by mouth daily.  Dispense: 30 tablet; Refill: 0 - Basic Metabolic Panel (BMET); Future - Basic Metabolic Panel (BMET)  2. Bilateral hip pain - He was advised to take 650 mg Tylenol every 8 hours as needed and not to exceed 4000 mg in a day. - He will follow up with  Dr Vickki Hearing Ambulatory referral to Orthopedic Surgery     Follow-up: Return in about 12 days  (around 02/13/2019), or if symptoms worsen or fail to improve.    Hershall Benkert Jerold Coombe, NP

## 2019-02-01 NOTE — Patient Instructions (Signed)

## 2019-02-02 LAB — BASIC METABOLIC PANEL
BUN/Creatinine Ratio: 12 (ref 9–20)
BUN: 26 mg/dL — ABNORMAL HIGH (ref 6–24)
CO2: 24 mmol/L (ref 20–29)
Calcium: 9.7 mg/dL (ref 8.7–10.2)
Chloride: 103 mmol/L (ref 96–106)
Creatinine, Ser: 2.24 mg/dL — ABNORMAL HIGH (ref 0.76–1.27)
GFR calc Af Amer: 36 mL/min/{1.73_m2} — ABNORMAL LOW (ref >59)
GFR calc non Af Amer: 31 mL/min/{1.73_m2} — ABNORMAL LOW (ref >59)
Glucose: 108 mg/dL — ABNORMAL HIGH (ref 65–99)
Potassium: 3.7 mmol/L (ref 3.5–5.2)
Sodium: 141 mmol/L (ref 134–144)

## 2019-02-13 ENCOUNTER — Encounter: Payer: Self-pay | Admitting: Gerontology

## 2019-02-13 ENCOUNTER — Ambulatory Visit: Payer: Self-pay | Admitting: Gerontology

## 2019-02-13 ENCOUNTER — Other Ambulatory Visit: Payer: Self-pay

## 2019-02-13 VITALS — BP 145/91 | HR 75 | Ht 66.0 in | Wt 187.0 lb

## 2019-02-13 DIAGNOSIS — I10 Essential (primary) hypertension: Secondary | ICD-10-CM

## 2019-02-13 MED ORDER — LOSARTAN POTASSIUM 50 MG PO TABS: 25.0000 mg | ORAL_TABLET | Freq: Every day | ORAL | 2 refills | Status: DC

## 2019-02-13 NOTE — Progress Notes (Signed)
Established Patient Office Visit  Subjective:  Patient ID: Paul Howard, male    DOB: 03-24-1960  Age: 58 y.o. MRN: [id number]  CC:  Chief Complaint  Patient presents with  . Hypertension    HPI Paul Howard presents for follow up of hypertension. He reports that he's compliant with his medication and brought his log to clinic. His blood pressure readings from 11/13 - 11/18 prior to picking up and taking his medications are as follows, his SBP ranges between 155-168 and DBP 89-127. Blood pressure readings since he started taking his medications on 02/08/2019, are as follows, SBP 122-144 and DBP 79- 98. He states that he's adhering to low salt diet and unable to exercise because of his chronic bilateral hip pain, but he has an appointment with Orthopedic Surgery Dr Vickki Hearing on 03/27/2019. He denies chest pain, palpitation, light headedness, cough , peripheral edema, fever and chills. He states that he's doing well and offers no further complaint.  Past Medical History:  Diagnosis Date  . Acute epigastric pain 10/22/2014  . AKI (acute kidney injury) ([place]) 10/22/2014  . Asthma   . HTN (hypertension) 10/22/2014  . Hypertension   . Pancreatitis   . Pancreatitis, alcoholic, acute 10/25/275    Past Surgical History:  Procedure Laterality Date  . TONSILLECTOMY      Family History  Problem Relation Age of Onset  . Hypertension Mother   . Throat cancer Father     Social History   Socioeconomic History  . Marital status: Single    Spouse name: Not on file  . Number of children: Not on file  . Years of education: 72  . Highest education level: 11th grade  Occupational History  . Not on file  Social Needs  . Financial resource strain: Hard  . Food insecurity    Worry: Often true    Inability: Often true  . Transportation needs    Medical: No    Non-medical: No  Tobacco Use  . Smoking status: Never Smoker  . Smokeless tobacco: Never Used  Substance and Sexual Activity  .  Alcohol use: Yes    Alcohol/week: 3.0 standard drinks    Types: 3 Cans of beer per week  . Drug use: Not Currently    Types: Cocaine  . Sexual activity: Not on file  Lifestyle  . Physical activity    Days per week: 2 days    Minutes per session: 20 min  . Stress: Not at all  Relationships  . Social Herbalist on phone: Twice a week    Gets together: Twice a week    Attends religious service: Never    Active member of club or organization: No    Attends meetings of clubs or organizations: Never    Relationship status: Not on file  . Intimate partner violence    Fear of current or ex partner: No    Emotionally abused: No    Physically abused: No    Forced sexual activity: No  Other Topics Concern  . Not on file  Social History Narrative   Not on food stamps, hasn't applied. No transportation. Walks everywhere. Close to work, but far from paying rent and grocery.    Outpatient Medications Prior to Visit  Medication Sig Dispense Refill  . amLODipine (NORVASC) 10 MG tablet Take 1 tablet (10 mg total) by mouth daily. 30 tablet 2  . Blood Pressure KIT 1 kit by Does not apply route daily.  1 kit 0  . cloNIDine (CATAPRES) 0.2 MG tablet Take 1 tablet (0.2 mg total) by mouth 2 (two) times daily. 60 tablet 2  . hydrALAZINE (APRESOLINE) 25 MG tablet Take 1 tablet (25 mg total) by mouth 2 (two) times daily. 60 tablet 3  . losartan (COZAAR) 50 MG tablet Take 0.5 tablets (25 mg total) by mouth daily. 30 tablet 0   No facility-administered medications prior to visit.     No Known Allergies  ROS Review of Systems  Constitutional: Negative.   Eyes: Negative.   Respiratory: Negative.   Cardiovascular: Negative.   Skin: Negative.   Neurological: Negative.   Psychiatric/Behavioral: Negative.       Objective:    Physical Exam  Constitutional: He is oriented to person, place, and time. He appears well-developed.  HENT:  Head: Normocephalic and atraumatic.  Eyes: Pupils  are equal, round, and reactive to light. EOM are normal.  Cardiovascular: Normal rate and regular rhythm.  Pulmonary/Chest: Effort normal and breath sounds normal.  Neurological: He is alert and oriented to person, place, and time.  Skin: Skin is warm and dry.  Psychiatric: He has a normal mood and affect. His behavior is normal. Judgment and thought content normal.    BP (!) 145/91 (BP Location: Right Arm, Patient Position: Sitting, Cuff Size: Large)   Pulse 75   Ht '5\' 6"'  (1.676 m)   Wt 187 lb (84.8 kg)   SpO2 96%   BMI 30.18 kg/m  Wt Readings from Last 3 Encounters:  02/13/19 187 lb (84.8 kg)  02/01/19 185 lb (83.9 kg)  10/26/18 184 lb (83.5 kg)   He was encouraged to continue on weight loss regimen.  Health Maintenance Due  Topic Date Due  . HIV Screening  12/07/1976  . TETANUS/TDAP  12/07/1980  . COLONOSCOPY  12/08/2011  . INFLUENZA VACCINE  10/21/2018    There are no preventive care reminders to display for this patient.  Lab Results  Component Value Date   TSH 0.987 01/12/2018   Lab Results  Component Value Date   WBC 6.9 12/28/2018   HGB 12.6 (L) 12/28/2018   HCT 37.0 (L) 12/28/2018   MCV 91 12/28/2018   PLT 269 12/28/2018   Lab Results  Component Value Date   NA 141 02/01/2019   K 3.7 02/01/2019   CO2 24 02/01/2019   GLUCOSE 108 (H) 02/01/2019   BUN 26 (H) 02/01/2019   CREATININE 2.24 (H) 02/01/2019   BILITOT 0.4 01/12/2018   ALKPHOS 63 01/12/2018   AST 19 01/12/2018   ALT 17 01/12/2018   PROT 7.2 12/28/2018   ALBUMIN 4.2 12/28/2018   CALCIUM 9.7 02/01/2019   ANIONGAP 9 10/27/2017   Lab Results  Component Value Date   CHOL 179 01/12/2018   Lab Results  Component Value Date   HDL 60 01/12/2018   Lab Results  Component Value Date   LDLCALC 107 (H) 01/12/2018   Lab Results  Component Value Date   TRIG 59 01/12/2018   Lab Results  Component Value Date   CHOLHDL 3.0 01/12/2018   Lab Results  Component Value Date   HGBA1C 5.4  01/12/2018      Assessment & Plan:   1. Essential hypertension - His blood pressure has improved since his last visit, his goal Bp is < 140/90. He will continue on current treatment regimen, advised to continue on low salt diet, and increase water intake. He was advised to measure blood pressure daily, record and bring  log to clinic visit. - losartan (COZAAR) 50 MG tablet; Take 0.5 tablets (25 mg total) by mouth daily.  Dispense: 30 tablet; Refill: 2 - Basic Metabolic Panel (BMET); Future - Lipid panel; Future     Follow-up: Return in about 7 weeks (around 04/03/2019), or if symptoms worsen or fail to improve.    Jaclin Finks Jerold Coombe, NP

## 2019-02-13 NOTE — Patient Instructions (Signed)

## 2019-03-01 ENCOUNTER — Other Ambulatory Visit: Payer: Self-pay

## 2019-03-01 DIAGNOSIS — Z20822 Contact with and (suspected) exposure to covid-19: Secondary | ICD-10-CM

## 2019-03-03 LAB — NOVEL CORONAVIRUS, NAA: SARS-CoV-2, NAA: NOT DETECTED

## 2019-03-27 ENCOUNTER — Ambulatory Visit: Payer: Self-pay | Admitting: Specialist

## 2019-03-29 ENCOUNTER — Other Ambulatory Visit: Payer: Self-pay

## 2019-04-03 ENCOUNTER — Ambulatory Visit: Payer: Self-pay | Admitting: Gerontology

## 2019-04-05 ENCOUNTER — Other Ambulatory Visit: Payer: Self-pay

## 2019-04-11 ENCOUNTER — Other Ambulatory Visit: Payer: Self-pay

## 2019-04-11 ENCOUNTER — Ambulatory Visit: Payer: Self-pay | Admitting: Gerontology

## 2019-04-11 DIAGNOSIS — I10 Essential (primary) hypertension: Secondary | ICD-10-CM

## 2019-04-12 LAB — BASIC METABOLIC PANEL
BUN/Creatinine Ratio: 18 (ref 9–20)
BUN: 27 mg/dL — ABNORMAL HIGH (ref 6–24)
CO2: 24 mmol/L (ref 20–29)
Calcium: 9.4 mg/dL (ref 8.7–10.2)
Chloride: 100 mmol/L (ref 96–106)
Creatinine, Ser: 1.54 mg/dL — ABNORMAL HIGH (ref 0.76–1.27)
GFR calc Af Amer: 57 mL/min/{1.73_m2} — ABNORMAL LOW (ref >59)
GFR calc non Af Amer: 49 mL/min/{1.73_m2} — ABNORMAL LOW (ref >59)
Glucose: 99 mg/dL (ref 65–99)
Potassium: 3.7 mmol/L (ref 3.5–5.2)
Sodium: 139 mmol/L (ref 134–144)

## 2019-04-12 LAB — LIPID PANEL
Chol/HDL Ratio: 3.6 ratio (ref 0.0–5.0)
Cholesterol, Total: 178 mg/dL (ref 100–199)
HDL: 49 mg/dL (ref >39)
LDL Chol Calc (NIH): 103 mg/dL — ABNORMAL HIGH (ref 0–99)
Triglycerides: 148 mg/dL (ref 0–149)
VLDL Cholesterol Cal: 26 mg/dL (ref 5–40)

## 2019-04-17 ENCOUNTER — Other Ambulatory Visit: Payer: Self-pay

## 2019-04-17 ENCOUNTER — Ambulatory Visit: Payer: Self-pay | Admitting: Specialist

## 2019-04-17 DIAGNOSIS — M25551 Pain in right hip: Secondary | ICD-10-CM

## 2019-04-17 DIAGNOSIS — M25552 Pain in left hip: Secondary | ICD-10-CM

## 2019-04-17 NOTE — Progress Notes (Signed)
   Subjective:    Patient ID: Paul Howard, male    DOB: 04-26-1960, 59 y.o.   MRN: [id number]  HPI 59 y/o with bilateral hip pain R>L. He is unable to take NSAIDs because of chronic kidney disease.   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   Review of Systems     Objective:   Physical Exam He has a waddling gait.        Assessment & Plan:  Refer to [place] for probable THAs.

## 2019-04-19 ENCOUNTER — Ambulatory Visit: Payer: Self-pay | Admitting: Urology

## 2019-04-19 VITALS — BP 170/97 | HR 89 | Ht 65.0 in | Wt 188.0 lb

## 2019-04-19 DIAGNOSIS — M25552 Pain in left hip: Secondary | ICD-10-CM

## 2019-04-19 DIAGNOSIS — M25551 Pain in right hip: Secondary | ICD-10-CM

## 2019-04-19 DIAGNOSIS — I10 Essential (primary) hypertension: Secondary | ICD-10-CM

## 2019-04-19 DIAGNOSIS — N181 Chronic kidney disease, stage 1: Secondary | ICD-10-CM

## 2019-04-19 MED ORDER — AMLODIPINE BESYLATE 10 MG PO TABS: 10.0000 mg | ORAL_TABLET | Freq: Every day | ORAL | 2 refills | Status: DC

## 2019-04-19 MED ORDER — LOSARTAN POTASSIUM 50 MG PO TABS: 25.0000 mg | ORAL_TABLET | Freq: Every day | ORAL | 2 refills | Status: DC

## 2019-04-19 NOTE — Progress Notes (Signed)
  Patient: Paul Howard Male    DOB: 30-May-1960   59 y.o.   MRN: [id number] Visit Date: 04/19/2019  Today's Provider: [place]   Chief Complaint  Patient presents with  . follw-up    hypertension, bilateral hip pain   . Medication Refill    losartan and amlodipine  . Back Pain   Subjective:    HPI  - HTN  BP @ home 121-169/72-106 - elevated tonight secondary to being out of losartin  - OA of the hips  Has forms for charity care at [facility] to see orthopedics  - Spondylosis of lumbar spine   Has forms for charity care at [facility] to see orthopedics    No Known Allergies Previous Medications   BLOOD PRESSURE KIT    1 kit by Does not apply route daily.   CLONIDINE (CATAPRES) 0.2 MG TABLET    Take 1 tablet (0.2 mg total) by mouth 2 (two) times daily.   HYDRALAZINE (APRESOLINE) 25 MG TABLET    Take 1 tablet (25 mg total) by mouth 2 (two) times daily.    Review of Systems  All other systems reviewed and are negative.   Social History   Tobacco Use  . Smoking status: Never Smoker  . Smokeless tobacco: Never Used  Substance Use Topics  . Alcohol use: Yes    Alcohol/week: 3.0 standard drinks    Types: 3 Cans of beer per week   Objective:   BP (!) 170/97   Pulse 89   Ht _0  (1.651 m)   Wt 188 lb (85.3 kg)   SpO2 99%   BMI 31.28 kg/m   Physical Exam Constitutional:  Well nourished. Alert and oriented, No acute distress. HEENT: [place] AT, mask in place.  Trachea midline, no masses. Cardiovascular: No clubbing, cyanosis, or edema. Respiratory: Normal respiratory effort, no increased work of breathing. Neurologic: Grossly intact, no focal deficits, moving all 4 extremities. Psychiatric: Normal mood and affect.      Assessment & Plan:   1. Essential hypertension - amLODipine (NORVASC) 10 MG tablet; Take 1 tablet (10 mg total) by mouth daily.  Dispense: 30 tablet; Refill: 2 - losartan (COZAAR) 50 MG tablet; Take 0.5 tablets (25 mg total) by mouth daily.   Dispense: 30 tablet; Refill: 2  2. Bilateral hip pain - referred to [facility] orthopedics  3. Back pain - see above   4. CKD - check CMP in 6 months with office visit       [facility]

## 2019-05-01 ENCOUNTER — Other Ambulatory Visit: Payer: Self-pay | Admitting: Urology

## 2019-05-01 DIAGNOSIS — I10 Essential (primary) hypertension: Secondary | ICD-10-CM

## 2019-05-01 MED ORDER — CLONIDINE HCL 0.2 MG PO TABS: 0.2000 mg | ORAL_TABLET | Freq: Two times a day (BID) | ORAL | 2 refills | Status: DC

## 2019-05-01 MED ORDER — AMLODIPINE BESYLATE 10 MG PO TABS: 10.0000 mg | ORAL_TABLET | Freq: Every day | ORAL | 2 refills | Status: DC

## 2019-05-01 MED ORDER — HYDRALAZINE HCL 25 MG PO TABS: 25.0000 mg | ORAL_TABLET | Freq: Two times a day (BID) | ORAL | 2 refills | Status: DC

## 2019-07-09 DIAGNOSIS — I129 Hypertensive chronic kidney disease with stage 1 through stage 4 chronic kidney disease, or unspecified chronic kidney disease: Secondary | ICD-10-CM | POA: Insufficient documentation

## 2019-08-08 ENCOUNTER — Other Ambulatory Visit: Payer: Self-pay

## 2019-08-08 DIAGNOSIS — I10 Essential (primary) hypertension: Secondary | ICD-10-CM

## 2019-08-08 MED ORDER — HYDRALAZINE HCL 25 MG PO TABS: 25.0000 mg | ORAL_TABLET | Freq: Two times a day (BID) | ORAL | 2 refills | Status: DC

## 2019-08-08 MED ORDER — CLONIDINE HCL 0.2 MG PO TABS: 0.2000 mg | ORAL_TABLET | Freq: Two times a day (BID) | ORAL | 2 refills | Status: DC

## 2019-08-08 MED ORDER — AMLODIPINE BESYLATE 10 MG PO TABS: 10.0000 mg | ORAL_TABLET | Freq: Every day | ORAL | 2 refills | Status: DC

## 2019-08-08 MED ORDER — LOSARTAN POTASSIUM 50 MG PO TABS: 25.0000 mg | ORAL_TABLET | Freq: Every day | ORAL | 2 refills | Status: DC

## 2019-10-04 ENCOUNTER — Other Ambulatory Visit: Payer: Self-pay

## 2019-10-04 DIAGNOSIS — N181 Chronic kidney disease, stage 1: Secondary | ICD-10-CM

## 2019-10-05 LAB — COMPREHENSIVE METABOLIC PANEL
ALT: 21 IU/L (ref 0–44)
AST: 18 IU/L (ref 0–40)
Albumin/Globulin Ratio: 1.5 (ref 1.2–2.2)
Albumin: 4.2 g/dL (ref 3.8–4.9)
Alkaline Phosphatase: 73 IU/L (ref 48–121)
BUN/Creatinine Ratio: 17 (ref 9–20)
BUN: 27 mg/dL — ABNORMAL HIGH (ref 6–24)
Bilirubin Total: 0.5 mg/dL (ref 0.0–1.2)
CO2: 22 mmol/L (ref 20–29)
Calcium: 9 mg/dL (ref 8.7–10.2)
Chloride: 104 mmol/L (ref 96–106)
Creatinine, Ser: 1.63 mg/dL — ABNORMAL HIGH (ref 0.76–1.27)
GFR calc Af Amer: 53 mL/min/{1.73_m2} — ABNORMAL LOW (ref >59)
GFR calc non Af Amer: 46 mL/min/{1.73_m2} — ABNORMAL LOW (ref >59)
Globulin, Total: 2.8 g/dL (ref 1.5–4.5)
Glucose: 119 mg/dL — ABNORMAL HIGH (ref 65–99)
Potassium: 3.1 mmol/L — ABNORMAL LOW (ref 3.5–5.2)
Sodium: 140 mmol/L (ref 134–144)
Total Protein: 7 g/dL (ref 6.0–8.5)

## 2019-10-11 ENCOUNTER — Ambulatory Visit: Payer: Self-pay | Admitting: Family Medicine

## 2019-10-11 ENCOUNTER — Other Ambulatory Visit: Payer: Self-pay

## 2019-10-11 VITALS — BP 155/91 | HR 71 | Temp 98.6°F | Ht 65.0 in | Wt 183.0 lb

## 2019-10-11 DIAGNOSIS — E876 Hypokalemia: Secondary | ICD-10-CM

## 2019-10-11 DIAGNOSIS — I10 Essential (primary) hypertension: Secondary | ICD-10-CM

## 2019-10-11 DIAGNOSIS — Z0000 Encounter for general adult medical examination without abnormal findings: Secondary | ICD-10-CM

## 2019-10-11 DIAGNOSIS — Z1211 Encounter for screening for malignant neoplasm of colon: Secondary | ICD-10-CM

## 2019-10-11 DIAGNOSIS — I120 Hypertensive chronic kidney disease with stage 5 chronic kidney disease or end stage renal disease: Secondary | ICD-10-CM

## 2019-10-11 DIAGNOSIS — N1831 Chronic kidney disease, stage 3a: Secondary | ICD-10-CM

## 2019-10-11 DIAGNOSIS — M25552 Pain in left hip: Secondary | ICD-10-CM

## 2019-10-11 MED ORDER — HYDRALAZINE HCL 25 MG PO TABS: 25.0000 mg | ORAL_TABLET | Freq: Three times a day (TID) | ORAL | 2 refills | Status: DC

## 2019-10-11 MED ORDER — CLONIDINE HCL 0.2 MG PO TABS: 0.2000 mg | ORAL_TABLET | Freq: Two times a day (BID) | ORAL | 2 refills | Status: DC

## 2019-10-11 MED ORDER — AMLODIPINE BESYLATE 10 MG PO TABS: 10.0000 mg | ORAL_TABLET | Freq: Every day | ORAL | 2 refills | Status: DC

## 2019-10-11 MED ORDER — LOSARTAN POTASSIUM 50 MG PO TABS: 25.0000 mg | ORAL_TABLET | Freq: Every day | ORAL | 2 refills | Status: DC

## 2019-10-11 NOTE — Assessment & Plan Note (Signed)
Follow up with orthopedics outpatient as scheduled - he has follow up in the fall - discussed could use topical voltaren sparingly in setting of CKD (low risk of systemic absorption)

## 2019-10-11 NOTE — Progress Notes (Addendum)
Problem List Items Addressed This Visit      Cardiovascular and Mediastinum   HTN (hypertension) - Primary (Chronic)    BP elevated to 629-528'U systolic today Refill BP meds today Will increase hydralazine to TID dosing Will keep losartan on current dose, ctm with CKD Repeat BMP, A1c prior to next visit      Relevant Medications   amLODipine (NORVASC) 10 MG tablet   cloNIDine (CATAPRES) 0.2 MG tablet   hydrALAZINE (APRESOLINE) 25 MG tablet   losartan (COZAAR) 50 MG tablet   Other Relevant Orders   HgB A1c     Genitourinary   CKD (chronic kidney disease), stage III    Follow up with renal as scheduled  Notably taking losartan, renal function appears stable, continue to monitor        Other   Bilateral hip pain    Follow up with orthopedics outpatient as scheduled - he has follow up in the fall - discussed could use topical voltaren sparingly in setting of CKD (low risk of systemic absorption)      Hypokalemia    Mild, continue to follow Repeat labs prior to next visit      Healthcare maintenance    Health Maintenance Due  Topic Date Due  . COVID-19 Vaccine (1) Never done  . HIV Screening  Never done  . TETANUS/TDAP  Never done  . COLONOSCOPY  Never done   Referral for colorectal cancer screening made today, follow up with GI outpatient for CRC screening  Briefly discussed prostate cancer screening today, consider additional discussion +/- PSA if he's interested at follow up       Relevant Orders   HgB A1c    Other Visit Diagnoses    Colon cancer screening       Relevant Orders   Ambulatory referral to Gastroenterology   Hypertensive kidney disease with CKD (chronic kidney disease) stage V ([place])       Relevant Orders   Basic Metabolic Panel (BMET)     Paul Howard is Paul Howard 59 y.o. male patient.  Hx HTN CKD  Arthritis   Here for Paul Howard follow up visit Same pain he's been having - arthritis in hip has appt with orthopedics doc in [facility] - pain in R  hip - ddd - pain mostly when up and about - works on cement floor   CKD - sees renal doc in 6 months  HTN - needs refills of his home meds - no CP - has BP cuff at home he uses when he thinks his BP maybe high   No fevers, chills, cp etoh 1 beer Paul Howard day Had both moderna shots Fam hx: no colon or prostate ca he knows of   Adult vaccines due  Topic Date Due  . TETANUS/TDAP  Never done   There are no preventive care reminders to display for this patient.   Health Maintenance Due  Topic Date Due  . COVID-19 Vaccine (1) Never done  . HIV Screening  Never done  . TETANUS/TDAP  Never done  . COLONOSCOPY  Never done   1. Essential hypertension   2. Essential hypertension   3. Colon cancer screening   4. Hypertensive kidney disease with CKD (chronic kidney disease) stage V ([place])   5. Bilateral hip pain   6. Stage 3a chronic kidney disease   7. Hypokalemia   8. Healthcare maintenance     Past Medical History:  Diagnosis Date  . Acute epigastric pain 10/22/2014  .  AKI (acute kidney injury) ([place]) 10/22/2014  . Arthritis   . Asthma   . HTN (hypertension) 10/22/2014  . Hypertension   . Pancreatitis   . Pancreatitis, alcoholic, acute 04/27/6387    Current Outpatient Medications  Medication Sig Dispense Refill  . acetaminophen (ACETAMINOPHEN 8 HOUR) 650 MG CR tablet Take 650 mg by mouth every 8 (eight) hours as needed for pain (arthritis).    Marland Kitchen amLODipine (NORVASC) 10 MG tablet Take 1 tablet (10 mg total) by mouth daily. 30 tablet 2  . Blood Pressure KIT 1 kit by Does not apply route daily. 1 kit 0  . cloNIDine (CATAPRES) 0.2 MG tablet Take 1 tablet (0.2 mg total) by mouth 2 (two) times daily. 60 tablet 2  . hydrALAZINE (APRESOLINE) 25 MG tablet Take 1 tablet (25 mg total) by mouth 3 (three) times daily. 90 tablet 2  . losartan (COZAAR) 50 MG tablet Take 0.5 tablets (25 mg total) by mouth daily. 30 tablet 2   No current facility-administered medications for this visit.   No Known  Allergies Active Problems:   * No active hospital problems. *  Blood pressure (!) 155/91, pulse 71, temperature 98.6 F (37 C), height _0  (1.651 m), weight 183 lb (83 kg), SpO2 96 %.  Review of Systems  Constitutional: Negative for chills and fever.  Respiratory: Negative for chest tightness.   Musculoskeletal: Positive for arthralgias.       Bilateral hip OA     Physical Exam Constitutional:      General: He is not in acute distress.    Appearance: Normal appearance. He is not ill-appearing.  HENT:     Head: Normocephalic and atraumatic.  Cardiovascular:     Rate and Rhythm: Normal rate and regular rhythm.  Pulmonary:     Effort: Pulmonary effort is normal. No respiratory distress.     Breath sounds: Normal breath sounds. No stridor. No wheezing, rhonchi or rales.  Abdominal:     General: There is no distension.     Palpations: Abdomen is soft.     Tenderness: There is no abdominal tenderness.  Musculoskeletal:     Cervical back: Neck supple.  Neurological:     General: No focal deficit present.     Mental Status: He is alert.     Fayrene Helper 10/12/2019

## 2019-10-11 NOTE — Assessment & Plan Note (Addendum)
Follow up with renal as scheduled  Notably taking losartan, renal function appears stable, continue to monitor

## 2019-10-11 NOTE — Patient Instructions (Addendum)
Your blood pressure was high, we'll increase your hydralazine to three times Ramandeep Arington day.  Please follow up in 3 months and keep an eye on your blood pressure at home.   Please follow up with gastroenterology for colon cancer screening.  Please follow up in 3 months to follow up blood pressure and repeat labs.

## 2019-10-12 DIAGNOSIS — Z0000 Encounter for general adult medical examination without abnormal findings: Secondary | ICD-10-CM | POA: Insufficient documentation

## 2019-10-12 DIAGNOSIS — Z7689 Persons encountering health services in other specified circumstances: Secondary | ICD-10-CM | POA: Insufficient documentation

## 2019-10-12 DIAGNOSIS — E876 Hypokalemia: Secondary | ICD-10-CM | POA: Insufficient documentation

## 2019-10-12 NOTE — Assessment & Plan Note (Addendum)
BP elevated to 336-122'E systolic today Refill BP meds today Will increase hydralazine to TID dosing Will keep losartan on current dose, ctm with CKD Repeat BMP, A1c prior to next visit

## 2019-10-12 NOTE — Assessment & Plan Note (Addendum)
Mild, continue to follow Repeat labs prior to next visit

## 2019-10-12 NOTE — Assessment & Plan Note (Signed)
Health Maintenance Due  Topic Date Due  . COVID-19 Vaccine (1) Never done  . HIV Screening  Never done  . TETANUS/TDAP  Never done  . COLONOSCOPY  Never done   Referral for colorectal cancer screening made today, follow up with GI outpatient for CRC screening  Briefly discussed prostate cancer screening today, consider additional discussion +/- PSA if he's interested at follow up

## 2020-01-10 ENCOUNTER — Other Ambulatory Visit: Payer: Self-pay

## 2020-01-10 DIAGNOSIS — Z0000 Encounter for general adult medical examination without abnormal findings: Secondary | ICD-10-CM

## 2020-01-10 DIAGNOSIS — I120 Hypertensive chronic kidney disease with stage 5 chronic kidney disease or end stage renal disease: Secondary | ICD-10-CM

## 2020-01-10 DIAGNOSIS — N185 Chronic kidney disease, stage 5: Secondary | ICD-10-CM

## 2020-01-10 DIAGNOSIS — I10 Essential (primary) hypertension: Secondary | ICD-10-CM

## 2020-01-11 LAB — BASIC METABOLIC PANEL
BUN/Creatinine Ratio: 16 (ref 9–20)
BUN: 27 mg/dL — ABNORMAL HIGH (ref 6–24)
CO2: 22 mmol/L (ref 20–29)
Calcium: 9 mg/dL (ref 8.7–10.2)
Chloride: 101 mmol/L (ref 96–106)
Creatinine, Ser: 1.68 mg/dL — ABNORMAL HIGH (ref 0.76–1.27)
GFR calc Af Amer: 51 mL/min/{1.73_m2} — ABNORMAL LOW (ref >59)
GFR calc non Af Amer: 44 mL/min/{1.73_m2} — ABNORMAL LOW (ref >59)
Glucose: 106 mg/dL — ABNORMAL HIGH (ref 65–99)
Potassium: 3.5 mmol/L (ref 3.5–5.2)
Sodium: 137 mmol/L (ref 134–144)

## 2020-01-11 LAB — HEMOGLOBIN A1C

## 2020-01-24 ENCOUNTER — Ambulatory Visit: Payer: Self-pay | Admitting: Family Medicine

## 2020-01-24 ENCOUNTER — Other Ambulatory Visit: Payer: Self-pay

## 2020-01-24 VITALS — BP 163/99 | HR 89 | Wt 179.0 lb

## 2020-01-24 DIAGNOSIS — N1831 Chronic kidney disease, stage 3a: Secondary | ICD-10-CM

## 2020-01-24 DIAGNOSIS — I10 Essential (primary) hypertension: Secondary | ICD-10-CM

## 2020-01-24 DIAGNOSIS — M1990 Unspecified osteoarthritis, unspecified site: Secondary | ICD-10-CM

## 2020-01-24 DIAGNOSIS — K047 Periapical abscess without sinus: Secondary | ICD-10-CM

## 2020-01-24 MED ORDER — AMLODIPINE BESYLATE 10 MG PO TABS: 10.0000 mg | ORAL_TABLET | Freq: Every day | ORAL | 1 refills | Status: DC

## 2020-01-24 MED ORDER — AMOXICILLIN 500 MG PO CAPS: 500.0000 mg | ORAL_CAPSULE | Freq: Three times a day (TID) | ORAL | 0 refills | Status: DC

## 2020-01-24 MED ORDER — HYDRALAZINE HCL 50 MG PO TABS: 50.0000 mg | ORAL_TABLET | Freq: Three times a day (TID) | ORAL | 1 refills | Status: DC

## 2020-01-24 NOTE — Progress Notes (Signed)
Established Patient Office Visit  Subjective:  Patient ID: Paul Howard, male    DOB: 14-Jun-1960  Age: 59 y.o. MRN: [id number]  CC: Toothache since yesterday and arthritis.  Chief Complaint  Patient presents with  . Hypertension  . Dental Pain  . Medication Refill    amlodapine    HPI Paul Howard presents for f/u HTN covid vaccine times 2.  Past Medical History:  Diagnosis Date  . Acute epigastric pain 10/22/2014  . AKI (acute kidney injury) ([place]) 10/22/2014  . Arthritis   . Asthma   . HTN (hypertension) 10/22/2014  . Hypertension   . Pancreatitis   . Pancreatitis, alcoholic, acute 0/08/3014    Past Surgical History:  Procedure Laterality Date  . TONSILLECTOMY      Family History  Problem Relation Age of Onset  . Hypertension Mother   . Throat cancer Father     Social History   Socioeconomic History  . Marital status: Single    Spouse name: Not on file  . Number of children: Not on file  . Years of education: 71  . Highest education level: 11th grade  Occupational History  . Occupation: Contractor  Tobacco Use  . Smoking status: Never Smoker  . Smokeless tobacco: Never Used  Vaping Use  . Vaping Use: Never used  Substance and Sexual Activity  . Alcohol use: Yes    Alcohol/week: 7.0 standard drinks    Types: 7 Cans of beer per week    Comment: 1 beer/day  . Drug use: Yes    Types: Cocaine    Comment: 2-3x/week  . Sexual activity: Not on file  Other Topics Concern  . Not on file  Social History Narrative   Not on food stamps, hasn't applied. No transportation. Walks everywhere. Close to work, but far from paying rent and grocery.   Social Determinants of Health   Financial Resource Strain:   . Difficulty of Paying Living Expenses: Not on file  Food Insecurity:   . Worried About Charity fundraiser in the Last Year: Not on file  . Ran Out of Food in the Last Year: Not on file  Transportation Needs:   . Lack of Transportation (Medical):  Not on file  . Lack of Transportation (Non-Medical): Not on file  Physical Activity:   . Days of Exercise per Week: Not on file  . Minutes of Exercise per Session: Not on file  Stress:   . Feeling of Stress : Not on file  Social Connections:   . Frequency of Communication with Friends and Family: Not on file  . Frequency of Social Gatherings with Friends and Family: Not on file  . Attends Religious Services: Not on file  . Active Member of Clubs or Organizations: Not on file  . Attends Archivist Meetings: Not on file  . Marital Status: Not on file  Intimate Partner Violence:   . Fear of Current or Ex-Partner: Not on file  . Emotionally Abused: Not on file  . Physically Abused: Not on file  . Sexually Abused: Not on file    Outpatient Medications Prior to Visit  Medication Sig Dispense Refill  . acetaminophen (ACETAMINOPHEN 8 HOUR) 650 MG CR tablet Take 650 mg by mouth every 8 (eight) hours as needed for pain (arthritis).    Marland Kitchen amLODipine (NORVASC) 10 MG tablet Take 1 tablet (10 mg total) by mouth daily. 30 tablet 2  . Blood Pressure KIT 1 kit by Does  not apply route daily. 1 kit 0  . cloNIDine (CATAPRES) 0.2 MG tablet Take 1 tablet (0.2 mg total) by mouth 2 (two) times daily. 60 tablet 2  . hydrALAZINE (APRESOLINE) 25 MG tablet Take 1 tablet (25 mg total) by mouth 3 (three) times daily. 90 tablet 2  . losartan (COZAAR) 50 MG tablet Take 0.5 tablets (25 mg total) by mouth daily. 30 tablet 2   No facility-administered medications prior to visit.    No Known Allergies  ROS Review of Systems    Objective:    Physical Exam Vitals reviewed.  Constitutional:      Appearance: He is well-developed.  HENT:     Head: Normocephalic and atraumatic.  Eyes:     Pupils: Pupils are equal, round, and reactive to light.  Cardiovascular:     Rate and Rhythm: Normal rate and regular rhythm.  Pulmonary:     Effort: Pulmonary effort is normal.     Breath sounds: Normal breath  sounds.  Skin:    General: Skin is warm and dry.  Neurological:     General: No focal deficit present.     Mental Status: He is alert and oriented to person, place, and time.  Psychiatric:        Mood and Affect: Mood normal.        Behavior: Behavior normal.        Thought Content: Thought content normal.        Judgment: Judgment normal.     BP (!) 163/99   Pulse 89   Wt 179 lb (81.2 kg)   BMI 29.79 kg/m  Wt Readings from Last 3 Encounters:  01/24/20 179 lb (81.2 kg)  10/11/19 183 lb (83 kg)  04/19/19 188 lb (85.3 kg)     Health Maintenance Due  Topic Date Due  . COVID-19 Vaccine (1) Never done  . HIV Screening  Never done  . TETANUS/TDAP  Never done  . COLONOSCOPY  Never done  . INFLUENZA VACCINE  Never done    There are no preventive care reminders to display for this patient.  Lab Results  Component Value Date   TSH 0.987 01/12/2018   Lab Results  Component Value Date   WBC 6.9 12/28/2018   HGB 12.6 (L) 12/28/2018   HCT 37.0 (L) 12/28/2018   MCV 91 12/28/2018   PLT 269 12/28/2018   Lab Results  Component Value Date   NA 137 01/10/2020   K 3.5 01/10/2020   CO2 22 01/10/2020   GLUCOSE 106 (H) 01/10/2020   BUN 27 (H) 01/10/2020   CREATININE 1.68 (H) 01/10/2020   BILITOT 0.5 10/04/2019   ALKPHOS 73 10/04/2019   AST 18 10/04/2019   ALT 21 10/04/2019   PROT 7.0 10/04/2019   ALBUMIN 4.2 10/04/2019   CALCIUM 9.0 01/10/2020   ANIONGAP 9 10/27/2017   Lab Results  Component Value Date   CHOL 178 04/11/2019   Lab Results  Component Value Date   HDL 49 04/11/2019   Lab Results  Component Value Date   LDLCALC 103 (H) 04/11/2019   Lab Results  Component Value Date   TRIG 148 04/11/2019   Lab Results  Component Value Date   CHOLHDL 3.6 04/11/2019   Lab Results  Component Value Date   HGBA1C CANCELED 01/10/2020      Assessment & Plan:   Problem List Items Addressed This Visit    None    1. Essential hypertension Increase dose of  hydralazine to  53m TID.RTC 3 months - amLODipine (NORVASC) 10 MG tablet; Take 1 tablet (10 mg total) by mouth daily.  Dispense: 90 tablet; Refill: 1  2. Tooth abscess Amoxil  3. Stage 3a chronic kidney disease ([place]    4. Arthritis Avoid NSAIDs   No orders of the defined types were placed in this encounter.   Follow-up: No follow-ups on file.    RWilhemena Durie MD

## 2020-02-28 ENCOUNTER — Other Ambulatory Visit: Payer: Self-pay

## 2020-02-28 ENCOUNTER — Ambulatory Visit: Payer: Self-pay | Admitting: Obstetrics and Gynecology

## 2020-02-28 DIAGNOSIS — I10 Essential (primary) hypertension: Secondary | ICD-10-CM

## 2020-02-28 MED ORDER — LOSARTAN POTASSIUM 50 MG PO TABS: 50.0000 mg | ORAL_TABLET | Freq: Every day | ORAL | 2 refills | Status: DC

## 2020-02-28 MED ORDER — CLONIDINE HCL 0.2 MG PO TABS: 0.2000 mg | ORAL_TABLET | Freq: Two times a day (BID) | ORAL | 0 refills | Status: DC

## 2020-02-28 NOTE — Patient Instructions (Signed)
I value your feedback and entrusting us with your care. If you get a [facility] patient survey, I would appreciate you taking the time to let us know about your experience today. Thank you!  As of March 01, 2019, your lab results will be released to your MyChart immediately, before I even have a chance to see them. Please give me time to review them and contact you if there are any abnormalities. Thank you for your patience.  

## 2020-02-28 NOTE — Progress Notes (Signed)
[facility], Paul    Howard, No Pcp Per   Chief Complaint  Howard presents with  . Medication Refill    Clonidine    HPI:      Paul Howard is a 59 y.o. No obstetric history on file. whose LMP was No LMP for male Howard., presents today for uncontrolled HTN. Pt on amlodipine 10 mg, losartan 25 mg (1/2 tab 50 mg), hydralazine 50 mg TID and clonidine  0.2 mg BID. Pt ran out of clonidine a couple days ago, but states his BP tonight is about what it is normally, maybe a little lower. Hx of stage 3 B CKD.  BP 11/21 was Paul same but pt had a tooth abscess. Hydralazine dose increased.    Past Medical History:  Diagnosis Date  . Acute epigastric pain 10/22/2014  . AKI (acute kidney injury) ([place]) 10/22/2014  . Arthritis   . Asthma   . HTN (hypertension) 10/22/2014  . Hypertension   . Pancreatitis   . Pancreatitis, alcoholic, acute 10/27/3252    Past Surgical History:  Procedure Laterality Date  . TONSILLECTOMY      Family History  Problem Relation Age of Onset  . Hypertension Mother   . Throat cancer Father     Social History   Socioeconomic History  . Marital status: Single    Spouse name: Not on file  . Number of children: Not on file  . Years of education: 75  . Highest education level: 11th grade  Occupational History  . Occupation: Contractor  Tobacco Use  . Smoking status: Never Smoker  . Smokeless tobacco: Never Used  Vaping Use  . Vaping Use: Never used  Substance and Sexual Activity  . Alcohol use: Yes    Alcohol/week: 7.0 standard drinks    Types: 7 Cans of beer per week    Comment: 1 beer/day  . Drug use: Yes    Types: Cocaine    Comment: 2-3x/week  . Sexual activity: Not on file  Other Topics Concern  . Not on file  Social History Narrative   Not on food stamps, hasn't applied. No transportation. Walks everywhere. Close to work, but far from paying rent and grocery.   Social Determinants of Health   Financial Resource  Strain: Not on file  Food Insecurity: Not on file  Transportation Needs: Not on file  Physical Activity: Not on file  Stress: Not on file  Social Connections: Not on file  Intimate Partner Violence: Not on file    Outpatient Medications Prior to Visit  Medication Sig Dispense Refill  . acetaminophen (TYLENOL) 650 MG CR tablet Take 650 mg by mouth every 8 (eight) hours as needed for pain (arthritis).    Marland Kitchen amLODipine (NORVASC) 10 MG tablet Take 1 tablet (10 mg total) by mouth daily. 90 tablet 1  . Blood Pressure KIT 1 kit by Does not apply route daily. 1 kit 0  . hydrALAZINE (APRESOLINE) 50 MG tablet Take 1 tablet (50 mg total) by mouth 3 (three) times daily. 270 tablet 1  . losartan (COZAAR) 50 MG tablet Take 0.5 tablets (25 mg total) by mouth daily. 30 tablet 2  . amoxicillin (AMOXIL) 500 MG capsule Take 1 capsule (500 mg total) by mouth 3 (three) times daily. (Howard not taking: Reported on 02/28/2020) 30 capsule 0  . cloNIDine (CATAPRES) 0.2 MG tablet Take 1 tablet (0.2 mg total) by mouth 2 (two) times daily. 60 tablet 2  No facility-administered medications prior to visit.     OBJECTIVE:   Vitals:  BP (!) 163/98 (BP Location: Left Arm, Howard Position: Sitting, Cuff Size: Normal)   Pulse (!) 103   Ht '5\' 5"'  (1.651 m)   Wt 178 lb 6.4 oz (80.9 kg)   SpO2 96%   BMI 29.69 kg/m   Physical Exam Pulmonary:     Effort: Pulmonary effort is normal.  Neurological:     General: No focal deficit present.     Mental Status: He is alert and oriented to person, place, and time.  Psychiatric:        Mood and Affect: Mood normal.        Behavior: Behavior normal.        Thought Content: Thought content normal.        Judgment: Judgment normal.     Assessment/Plan: Essential hypertension - Rx RF amlodipine for 3 months. Recent BMI WNl - Plan: losartan (COZAAR) 50 MG tablet, cloNIDine (CATAPRES) 0.2 MG tablet, Comprehensive metabolic panel  Uncontrolled HTN. Increase losartan to 50  mg daily (increase to 1 tab, up from 1/2 tab). Rx RF clonidine. RTO in 1 mo for HTN f/u and CMP.   Meds ordered this encounter  Medications  . losartan (COZAAR) 50 MG tablet    Sig: Take 1 tablet (50 mg total) by mouth daily.    Dispense:  30 tablet    Refill:  2    Order Specific Question:   Supervising Provider    Answer:   Gae Dry [id number]  . cloNIDine (CATAPRES) 0.2 MG tablet    Sig: Take 1 tablet (0.2 mg total) by mouth 2 (two) times daily.    Dispense:  60 tablet    Refill:  0    Order Specific Question:   Supervising Provider    Answer:   Gae Dry [[id number]]      Return in about 4 weeks (around 03/27/2020) for HTN f/u and labs.  Janeshia Ciliberto B. Aaima Gaddie, PA-C 02/28/2020 7:36 PM

## 2020-03-03 ENCOUNTER — Emergency Department: Payer: Self-pay

## 2020-03-03 ENCOUNTER — Other Ambulatory Visit: Payer: Self-pay

## 2020-03-03 ENCOUNTER — Emergency Department
Admission: EM | Admit: 2020-03-03 | Discharge: 2020-03-04 | Disposition: A | Discharge status: Home / Self Care | Payer: Self-pay | Attending: Emergency Medicine | Admitting: Emergency Medicine

## 2020-03-03 DIAGNOSIS — Z79899 Other long term (current) drug therapy: Secondary | ICD-10-CM | POA: Insufficient documentation

## 2020-03-03 DIAGNOSIS — N1831 Chronic kidney disease, stage 3a: Secondary | ICD-10-CM | POA: Insufficient documentation

## 2020-03-03 DIAGNOSIS — I6501 Occlusion and stenosis of right vertebral artery: Secondary | ICD-10-CM

## 2020-03-03 DIAGNOSIS — M542 Cervicalgia: Secondary | ICD-10-CM | POA: Insufficient documentation

## 2020-03-03 DIAGNOSIS — I129 Hypertensive chronic kidney disease with stage 1 through stage 4 chronic kidney disease, or unspecified chronic kidney disease: Secondary | ICD-10-CM | POA: Insufficient documentation

## 2020-03-03 DIAGNOSIS — J45909 Unspecified asthma, uncomplicated: Secondary | ICD-10-CM | POA: Insufficient documentation

## 2020-03-03 DIAGNOSIS — M5412 Radiculopathy, cervical region: Secondary | ICD-10-CM

## 2020-03-03 LAB — CBC WITH DIFFERENTIAL/PLATELET
Abs Immature Granulocytes: 0.01 10*3/uL (ref 0.00–0.07)
Basophils Absolute: 0.1 10*3/uL (ref 0.0–0.1)
Basophils Relative: 1 %
Eosinophils Absolute: 0.1 10*3/uL (ref 0.0–0.5)
Eosinophils Relative: 3 %
HCT: 41.6 % (ref 39.0–52.0)
Hemoglobin: 13.7 g/dL (ref 13.0–17.0)
Immature Granulocytes: 0 %
Lymphocytes Relative: 38 %
Lymphs Abs: 2.1 10*3/uL (ref 0.7–4.0)
MCH: 30.5 pg (ref 26.0–34.0)
MCHC: 32.9 g/dL (ref 30.0–36.0)
MCV: 92.7 fL (ref 80.0–100.0)
Monocytes Absolute: 0.7 10*3/uL (ref 0.1–1.0)
Monocytes Relative: 13 %
Neutro Abs: 2.5 10*3/uL (ref 1.7–7.7)
Neutrophils Relative %: 45 %
Platelets: 232 10*3/uL (ref 150–400)
RBC: 4.49 MIL/uL (ref 4.22–5.81)
RDW: 12.8 % (ref 11.5–15.5)
WBC: 5.5 10*3/uL (ref 4.0–10.5)
nRBC: 0 % (ref 0.0–0.2)

## 2020-03-03 LAB — BASIC METABOLIC PANEL
Anion gap: 10 (ref 5–15)
BUN: 21 mg/dL — ABNORMAL HIGH (ref 6–20)
CO2: 26 mmol/L (ref 22–32)
Calcium: 9 mg/dL (ref 8.9–10.3)
Chloride: 101 mmol/L (ref 98–111)
Creatinine, Ser: 1.09 mg/dL (ref 0.61–1.24)
GFR, Estimated: >60 mL/min (ref >60)
Glucose, Bld: 97 mg/dL (ref 70–99)
Potassium: 3.5 mmol/L (ref 3.5–5.1)
Sodium: 137 mmol/L (ref 135–145)

## 2020-03-03 LAB — TROPONIN I (HIGH SENSITIVITY)
Troponin I (High Sensitivity): 10 ng/L (ref <18)
Troponin I (High Sensitivity): 10 ng/L (ref <18)

## 2020-03-03 MED ORDER — GADOBUTROL 1 MMOL/ML IV SOLN
8.0000 mL | Freq: Once | INTRAVENOUS | Status: AC | PRN
  Administered 2020-03-03: 22:00:00 8 mL via INTRAVENOUS
  Filled 2020-03-03: qty 8

## 2020-03-03 MED ORDER — ASPIRIN EC 81 MG PO TBEC: 81.0000 mg | DELAYED_RELEASE_TABLET | Freq: Every day | ORAL | 1 refills | Status: DC

## 2020-03-03 MED ORDER — HYDROMORPHONE HCL 1 MG/ML IJ SOLN
0.5000 mg | Freq: Once | INTRAMUSCULAR | Status: AC
  Administered 2020-03-03: 21:00:00 0.5 mg via INTRAVENOUS

## 2020-03-03 MED ORDER — HYDROMORPHONE HCL 1 MG/ML IJ SOLN
INTRAMUSCULAR | Status: AC
  Filled 2020-03-03: qty 1

## 2020-03-03 MED ORDER — HYDROMORPHONE HCL 1 MG/ML IJ SOLN: 0.5000 mg | Freq: Once | INTRAMUSCULAR | Status: AC

## 2020-03-03 MED ORDER — CLONIDINE HCL 0.2 MG PO TABS: 0.2000 mg | ORAL_TABLET | Freq: Two times a day (BID) | ORAL | 1 refills | Status: DC

## 2020-03-03 MED ORDER — OXYCODONE-ACETAMINOPHEN 5-325 MG PO TABS: 1.0000 | ORAL_TABLET | ORAL | 0 refills | Status: DC | PRN

## 2020-03-03 MED ORDER — ONDANSETRON HCL 4 MG/2ML IJ SOLN
4.0000 mg | Freq: Once | INTRAMUSCULAR | Status: AC
  Administered 2020-03-03: 17:00:00 4 mg via INTRAVENOUS
  Filled 2020-03-03: qty 2

## 2020-03-03 MED ORDER — LIDOCAINE 5 % EX PTCH: 1.0000 | MEDICATED_PATCH | Freq: Two times a day (BID) | CUTANEOUS | 0 refills | Status: DC

## 2020-03-03 MED ORDER — HYDROMORPHONE HCL 1 MG/ML IJ SOLN
INTRAMUSCULAR | Status: AC
  Administered 2020-03-03: 17:00:00 0.5 mg via INTRAVENOUS
  Filled 2020-03-03: qty 1

## 2020-03-03 NOTE — ED Notes (Signed)
Pt with bilateral arthritis   Last tylenol 1300mg  at 1100  Pt has taken BP meds today, is out of one of his meds (clonidine) since last week. Has refill ordered but can't fill for a couple days

## 2020-03-03 NOTE — ED Notes (Signed)
LG, LV, Blue, SST sent to lab with chart labels (printer not working)

## 2020-03-03 NOTE — ED Provider Notes (Signed)
-----------------------------------------   11:40 PM on 03/03/2020 -----------------------------------------  Patient received in turnover pending MRI brain, MRA, and MRI cervical spine results after he presented with acute onset neck pain radiating into his arm.  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  On reassessment, patient with minimal pain and neurovascularly intact to all 4 extremities.  Vertebral artery occlusion discussed with Dr. Lucky Cowboy from vascular surgery, who agrees that it is likely a chronic finding and unrelated to patient's presentation.  He does recommend starting patient on aspirin and having him follow-up in the vascular surgery clinic.  Otherwise, patient symptoms more likely related to cervical foraminal stenosis and resulting cervical radiculopathy.  He is appropriate for discharge home with PCP and vascular surgery follow-up, was prescribed pain medications per previous provider.  Patient agrees with plan.    Blake Divine, MD 03/03/20 [phone]

## 2020-03-03 NOTE — ED Notes (Signed)
Per MRI, they will likely not be able to get to him until after 2100

## 2020-03-03 NOTE — ED Triage Notes (Signed)
Pt reports while he was at work on 12-10 he developed neck pain. Pt reports neck pain came on suddenly, denies known injury. Pt reports midline cervical neck pain with limited ROM turning to the left side. Pain radiates mildly into upper back  Denies CP, some mild SOB when ambulating. No n/v/d, fevers, abd pain   Pt alert and oriented x4

## 2020-03-03 NOTE — ED Provider Notes (Signed)
[facility] Emergency Department Provider Note   ____________________________________________   Event Date/Time   First MD Initiated Contact with Patient 03/03/20 1354     (approximate)  I have reviewed the triage vital signs and the nursing notes.   HISTORY  Chief Complaint Torticollis   HPI Paul Howard is a 59 y.o. male he was at work Conservation officer, [employer].  He developed sudden onset of pain in his neck radiating into the right shoulder and tingling and numbness down the left arm.  This has improved and worsened several times.  Each time when it came on the numbness got worse.  Currently he is not having any numbness in his arm he is having some pain in the low neck and radiation into the right shoulder along the trapezius.  He is not worse with moving.  He now can turn his head without a great deal of difficulty.  The pain is radiating down into the upper part of the back to T1 or 2 possibly patient has known degenerative change in his lumbar spine which is severely impacted his walking giving him is slow since shuffling gait.  He also has a history of kidney injury from Naprosyn.  He ran out of his clonidine about a week ago.         Past Medical History:  Diagnosis Date  . Acute epigastric pain 10/22/2014  . AKI (acute kidney injury) ([place]) 10/22/2014  . Arthritis   . Asthma   . HTN (hypertension) 10/22/2014  . Hypertension   . Pancreatitis   . Pancreatitis, alcoholic, acute 4/0/9811    Patient Active Problem List   Diagnosis Date Noted  . Hypokalemia 10/12/2019  . Healthcare maintenance 10/12/2019  . CKD (chronic kidney disease), stage III ([place]) 10/11/2019  . Bilateral hip pain 02/01/2019  . Back pain 12/29/2017  . Acute epigastric pain 10/22/2014  . AKI (acute kidney injury) ([place]) 10/22/2014  . HTN (hypertension) 10/22/2014  . Pancreatitis, alcoholic, acute 91/47/8295    Past Surgical History:  Procedure Laterality Date  . TONSILLECTOMY       Prior to Admission medications   Medication Sig Start Date End Date Taking? Authorizing Provider  acetaminophen (TYLENOL) 650 MG CR tablet Take 650 mg by mouth every 8 (eight) hours as needed for pain (arthritis).    [provider]  amLODipine (NORVASC) 10 MG tablet Take 1 tablet (10 mg total) by mouth daily. 01/24/20 04/23/20  Jerrol Banana., MD  amoxicillin (AMOXIL) 500 MG capsule Take 1 capsule (500 mg total) by mouth 3 (three) times daily. Patient not taking: Reported on 02/28/2020 01/24/20   Jerrol Banana., MD  Blood Pressure KIT 1 kit by Does not apply route daily. 02/01/19   Iloabachie, Chioma E, NP  cloNIDine (CATAPRES) 0.2 MG tablet Take 1 tablet (0.2 mg total) by mouth 2 (two) times daily. 62/1/30   Copland, Deirdre Evener, PA-C  cloNIDine (CATAPRES) 0.2 MG tablet Take 1 tablet (0.2 mg total) by mouth 2 (two) times daily. 03/03/20 03/03/21  Nena Polio, MD  hydrALAZINE (APRESOLINE) 50 MG tablet Take 1 tablet (50 mg total) by mouth 3 (three) times daily. 01/24/20   Jerrol Banana., MD  losartan (COZAAR) 50 MG tablet Take 1 tablet (50 mg total) by mouth daily. 86/5/78   Copland, Deirdre Evener, PA-C  oxyCODONE-acetaminophen (PERCOCET) 5-325 MG tablet Take 1 tablet by mouth every 4 (four) hours as needed for severe pain. 03/03/20 03/03/21  Conni Slipper  F, MD    Allergies Patient has no known allergies.  Family History  Problem Relation Age of Onset  . Hypertension Mother   . Throat cancer Father     Social History Social History   Tobacco Use  . Smoking status: Never Smoker  . Smokeless tobacco: Never Used  Vaping Use  . Vaping Use: Never used  Substance Use Topics  . Alcohol use: Yes    Alcohol/week: 7.0 standard drinks    Types: 7 Cans of beer per week    Comment: 1 beer/day  . Drug use: Yes    Types: Marijuana    Comment: 2-3x/week    Review of Systems  Constitutional: No fever/chills Eyes: No visual changes. ENT: No sore  throat. Cardiovascular: Denies chest pain. Respiratory: Denies shortness of breath. Gastrointestinal: No abdominal pain.  No nausea, no vomiting.  No diarrhea.  No constipation. Genitourinary: Negative for dysuria. Musculoskeletal: Negative for back pain. Skin: Negative for rash. Neurological: Negative for headaches, focal weakness   ____________________________________________   PHYSICAL EXAM:  VITAL SIGNS: ED Triage Vitals  Enc Vitals Group     BP 03/03/20 1336 (!) 176/102     Pulse Rate 03/03/20 1336 83     Resp 03/03/20 1336 16     Temp 03/03/20 1336 98.6 F (37 C)     Temp Source 03/03/20 1336 Oral     SpO2 03/03/20 1336 100 %     Weight 03/03/20 1337 178 lb (80.7 kg)     Height 03/03/20 1337 '5\' 5"'  (1.651 m)     Head Circumference --      Peak Flow --      Pain Score 03/03/20 1337 10     Pain Loc --      Pain Edu? --      Excl. in [place]? --     Constitutional: Alert and oriented. Well appearing and in no acute distress. Eyes: Conjunctivae are normal. PERRL. EOMI. Head: Atraumatic. Nose: No congestion/rhinnorhea. Mouth/Throat: Mucous membranes are moist.  Oropharynx non-erythematous. Neck: No stridor.  Cardiovascular: Normal rate, regular rhythm. Grossly normal heart sounds.  Good peripheral circulation. Respiratory: Normal respiratory effort.  No retractions. Lungs CTAB. Gastrointestinal: Soft and nontender. No distention. No abdominal bruits. No CVA tenderness. Musculoskeletal: No lower extremity tenderness nor edema.   Neurologic:  Normal speech and language. No new gross focal neurologic deficits are appreciated.  Cranial nerves II through XII are intact of the visual fields were not checked cerebellar finger-nose and rapid alternating movements and hands are normal motor strength is 5/5 throughout.  Patient is not currently experiencing any numbness.  Patient has a show slow shuffling gait with very small steps.  He reports this is not new but is from his known  lumbar disease. Skin:  Skin is warm, dry and intact. No rash noted.   ____________________________________________   LABS (all labs ordered are listed, but only abnormal results are displayed)  Labs Reviewed  BASIC METABOLIC PANEL - Abnormal; Notable for the following components:      Result Value   BUN 21 (*)    All other components within normal limits  CBC WITH DIFFERENTIAL/PLATELET  TROPONIN I (HIGH SENSITIVITY)  TROPONIN I (HIGH SENSITIVITY)   ____________________________________________  EKG  EKG read interpreted by me shows normal sinus rhythm at a rate of 79 normal axis multiple flipped T waves in leads I to aVF V5 and 6 these are similar to EKGs done previously in 2018 and 19. ____________________________________________  RADIOLOGY  Gertha Calkin, personally viewed and evaluated these images (plain radiographs) as part of my medical decision making, as well as reviewing the written report by the radiologist.  ED MD interpretation:    Official radiology report(s): No results found.  ____________________________________________   PROCEDURES  Procedure(s) performed (including Critical Care):  Procedures   ____________________________________________   INITIAL IMPRESSION / ASSESSMENT AND PLAN / ED COURSE  Patient is pending results of MRI.  Differential diagnosis includes vertebral artery dissection, herniated disc with spinal cord impingement, herniated disc without spinal cord impingement, stroke, muscle strain, muscle spasm and factitious pain.  I am most worried about a herniated disc.  The MRIs should be able to tell what is going on.  I signed the patient out to Dr. Archie Balboa as patient just went off the MRI now.  We tried to do earlier but he could not take the pain and had to get pain medication.            ____________________________________________   FINAL CLINICAL IMPRESSION(S) / ED DIAGNOSES  Final diagnoses:  Neck pain     ED  Discharge Orders         Ordered    cloNIDine (CATAPRES) 0.2 MG tablet  2 times daily        03/03/20 1441    oxyCODONE-acetaminophen (PERCOCET) 5-325 MG tablet  Every 4 hours PRN        03/03/20 2202          *Please note:  Paul Howard was evaluated in Emergency Department on 03/03/2020 for the symptoms described in the history of present illness. He was evaluated in the context of the global COVID-19 pandemic, which necessitated consideration that the patient might be at risk for infection with the SARS-CoV-2 virus that causes COVID-19. Institutional protocols and algorithms that pertain to the evaluation of patients at risk for COVID-19 are in a state of rapid change based on information released by regulatory bodies including the CDC and federal and state organizations. These policies and algorithms were followed during the patient's care in the ED.  Some ED evaluations and interventions may be delayed as a result of limited staffing during and the pandemic.*   Note:  This document was prepared using Dragon voice recognition software and may include unintentional dictation errors.    Nena Polio, MD 03/03/20 2204

## 2020-03-03 NOTE — ED Triage Notes (Signed)
First Nurse Note:  C/O sharp pain to back of neck that radiates right right scapular area x 3 days.  AAOx3.  Skin warm and dry. NAD

## 2020-03-03 NOTE — Discharge Instructions (Addendum)
Please return for worse pain or numbness or weakness.  Make sure to take your clonidine.  I refilled the prescription for you.  Follow-up with your regular doctor.  You can take Percocet 1 pill up to 4 times a day as needed for pain.  Afterwards use regular Tylenol 1 extra strength 4 times a day as needed.  For worse pain follow-up with your regular doctor return here.

## 2020-03-03 NOTE — ED Notes (Signed)
Pt reports he takes multiple BP meds for hx of HTN. Pt reports he takes tylenol for pain. Used to take naproxen but had some acute kidney problems a while ago, so does not anymore  Pt does say "when my neck hurts real bad I feel like it makes my heart beat fast, like a flutter," and that he "gets a little out of breath when walking around a lot."  Pt with noted limp to his gait, which gets better after several steps. Pt reports occasional cane use at home

## 2020-03-03 NOTE — ED Notes (Signed)
Pt transported to MRI at this time 

## 2020-03-27 ENCOUNTER — Ambulatory Visit: Payer: Self-pay

## 2020-04-24 ENCOUNTER — Other Ambulatory Visit: Payer: Self-pay

## 2020-04-24 ENCOUNTER — Ambulatory Visit: Payer: Self-pay | Admitting: Family Medicine

## 2020-04-24 VITALS — BP 166/73 | HR 98 | Temp 97.0°F | Ht 65.0 in | Wt 176.8 lb

## 2020-04-24 DIAGNOSIS — I10 Essential (primary) hypertension: Secondary | ICD-10-CM

## 2020-04-24 DIAGNOSIS — I779 Disorder of arteries and arterioles, unspecified: Secondary | ICD-10-CM

## 2020-04-24 DIAGNOSIS — R946 Abnormal results of thyroid function studies: Secondary | ICD-10-CM

## 2020-04-24 DIAGNOSIS — Z8673 Personal history of transient ischemic attack (TIA), and cerebral infarction without residual deficits: Secondary | ICD-10-CM

## 2020-04-24 DIAGNOSIS — M5412 Radiculopathy, cervical region: Secondary | ICD-10-CM

## 2020-04-24 DIAGNOSIS — N1831 Chronic kidney disease, stage 3a: Secondary | ICD-10-CM

## 2020-04-24 DIAGNOSIS — E785 Hyperlipidemia, unspecified: Secondary | ICD-10-CM

## 2020-04-24 MED ORDER — HYDROCHLOROTHIAZIDE 25 MG PO TABS: 12.5000 mg | ORAL_TABLET | Freq: Every day | ORAL | 0 refills | Status: DC

## 2020-04-24 MED ORDER — LOSARTAN POTASSIUM 50 MG PO TABS: 50.0000 mg | ORAL_TABLET | Freq: Every day | ORAL | 2 refills | Status: DC

## 2020-04-24 MED ORDER — ASPIRIN EC 81 MG PO TBEC: 81.0000 mg | DELAYED_RELEASE_TABLET | Freq: Every day | ORAL | 1 refills | Status: AC

## 2020-04-24 MED ORDER — AMLODIPINE BESYLATE 10 MG PO TABS: 10.0000 mg | ORAL_TABLET | Freq: Every day | ORAL | 2 refills | Status: DC

## 2020-04-24 MED ORDER — HYDRALAZINE HCL 50 MG PO TABS: 50.0000 mg | ORAL_TABLET | Freq: Three times a day (TID) | ORAL | 2 refills | Status: DC

## 2020-04-24 MED ORDER — ATORVASTATIN CALCIUM 40 MG PO TABS: 40.0000 mg | ORAL_TABLET | Freq: Every day | ORAL | 0 refills | Status: DC

## 2020-04-24 NOTE — Progress Notes (Signed)
Problem List Items Addressed This Visit      Cardiovascular and Mediastinum   HTN (hypertension) (Chronic)    Hypertension today, 158/90 on manual repeat He ran out of clonidine Taking amlodipine, losartan, hydral.  Last creatinine was improved, planned to add on HCTZ, but creatinine on repeat is 1.8.  Called pt regarding changing meds -> stop HCTZ.  Start labetalol.  Decrease losartan to 25 mg and follow.      Relevant Medications   amLODipine (NORVASC) 10 MG tablet   aspirin EC 81 MG tablet   hydrALAZINE (APRESOLINE) 50 MG tablet   atorvastatin (LIPITOR) 40 MG tablet   losartan (COZAAR) 50 MG tablet   labetalol (NORMODYNE) 100 MG tablet   Vertebral artery disease ([place])    Recent ED visit with imaging that showed occlusion of R vertebral artery Vascular noted likely chronic finding per EDP note Recommended vascular surgery follow up outpatient       Relevant Medications   amLODipine (NORVASC) 10 MG tablet   aspirin EC 81 MG tablet   hydrALAZINE (APRESOLINE) 50 MG tablet   atorvastatin (LIPITOR) 40 MG tablet   losartan (COZAAR) 50 MG tablet   labetalol (NORMODYNE) 100 MG tablet   Other Relevant Orders   Ambulatory referral to Vascular Surgery     Nervous and Auditory   Cervical radiculopathy    ED visit in Dec for R neck/arm pain MRI with neural foraminal stenosis Symptoms currently resolved Follow up with neurology outpatient         Genitourinary   CKD (chronic kidney disease), stage III (HCC)    Baseline creatinine has fluctuated widely -> 1.5-1.68 (but around 2 previously) He's on ARB and was planning to start HCTZ Will need repeat labs at next visit   Creatinine is 1.85 today, decrease losartan to 25.  Follow creatinine at next visit.         Other   History of stroke - Primary    Imaging shows old right cerebellar infarct  Starting aspirin and statin  Follow A1c, lipid panel  Neurology follow up       Relevant Orders   Ambulatory referral to  Neurology   Abnormal thyroid function test    Slightly elevated TSH Follow repeat TSH and follow free T4 at next visit.       Dyslipidemia    The 10-year ASCVD risk score [name], et al., 2013) is: 18.8%   Values used to calculate the score:     Age: 51 years     Sex: Male     Is Non-Hispanic African American: Yes     Diabetic: No     Tobacco smoker: No     Systolic Blood Pressure: 219 mmHg     Is BP treated: Yes     HDL Cholesterol: 52 mg/dL     Total Cholesterol: 181 mg/dL  Hx stroke based on imaging, started atorvastatin/ASA      Relevant Medications   atorvastatin (LIPITOR) 40 MG tablet    Other Visit Diagnoses    Essential hypertension       Rx RF amlodipine for 3 months. Recent BMI WNl   Relevant Medications   amLODipine (NORVASC) 10 MG tablet   aspirin EC 81 MG tablet   hydrALAZINE (APRESOLINE) 50 MG tablet   atorvastatin (LIPITOR) 40 MG tablet   losartan (COZAAR) 50 MG tablet   labetalol (NORMODYNE) 100 MG tablet   Other Relevant Orders   Comp Met (CMET) (Completed)  HgB A1c (Completed)   Lipid Profile (Completed)   Basic Metabolic Panel (BMET)   TSH (Completed)      Established Patient Office Visit  Subjective:  Patient ID: Paul Howard, male    DOB: [birth year]  Age: 60 y.o. MRN: [id number]  CC: No chief complaint on file.   HPI Paul Howard presents for med refills.  No complaints today  Hypertension: no cp, SOB.  No LH, dizziness.  Amlodipine 10, losartan 50, hydralizine 50 TID.  Ran out of clonidine - about 3 weeks ago.    Recent ED visit - pinched nerve in neck .  Neck down R shoulder.  To about bicep.  No numbness, tingling, weakness.  Better now.  Better after topical medicine on neck.   No smoking.  Occasional etoh.  1 can/day.  Past Medical History:  Diagnosis Date  . Acute epigastric pain 10/22/2014  . AKI (acute kidney injury) ([place]) 10/22/2014  . Arthritis   . Asthma   . HTN (hypertension) 10/22/2014  . Hypertension   .  Pancreatitis   . Pancreatitis, alcoholic, acute 08/20/4429    Past Surgical History:  Procedure Laterality Date  . TONSILLECTOMY      Family History  Problem Relation Age of Onset  . Hypertension Mother   . Throat cancer Father     Social History   Socioeconomic History  . Marital status: Single    Spouse name: Not on file  . Number of children: Not on file  . Years of education: 62  . Highest education level: 11th grade  Occupational History  . Occupation: Contractor  Tobacco Use  . Smoking status: Never Smoker  . Smokeless tobacco: Never Used  Vaping Use  . Vaping Use: Never used  Substance and Sexual Activity  . Alcohol use: Yes    Alcohol/week: 7.0 standard drinks    Types: 7 Cans of beer per week    Comment: 1 beer/day  . Drug use: Yes    Types: Marijuana    Comment: 2-3x/week  . Sexual activity: Not on file  Other Topics Concern  . Not on file  Social History Narrative   Not on food stamps, hasn't applied. No transportation. Walks everywhere. Close to work, but far from paying rent and grocery.   Social Determinants of Health   Financial Resource Strain: Not on file  Food Insecurity: Not on file  Transportation Needs: Not on file  Physical Activity: Not on file  Stress: Not on file  Social Connections: Not on file  Intimate Partner Violence: Not on file    Outpatient Medications Prior to Visit  Medication Sig Dispense Refill  . acetaminophen (TYLENOL) 650 MG CR tablet Take 650 mg by mouth every 8 (eight) hours as needed for pain (arthritis).    . Blood Pressure KIT 1 kit by Does not apply route daily. 1 kit 0  . lidocaine (LIDODERM) 5 % Place 1 patch onto the skin every 12 (twelve) hours. Remove & Discard patch within 12 hours or as directed by MD 10 patch 0  . oxyCODONE-acetaminophen (PERCOCET) 5-325 MG tablet Take 1 tablet by mouth every 4 (four) hours as needed for severe pain. 10 tablet 0  . amLODipine (NORVASC) 10 MG tablet Take 1 tablet (10 mg  total) by mouth daily. 90 tablet 1  . amoxicillin (AMOXIL) 500 MG capsule Take 1 capsule (500 mg total) by mouth 3 (three) times daily. (Patient not taking: Reported on 02/28/2020) 30 capsule 0  .  aspirin EC 81 MG tablet Take 1 tablet (81 mg total) by mouth daily. Swallow whole. 150 tablet 1  . cloNIDine (CATAPRES) 0.2 MG tablet Take 1 tablet (0.2 mg total) by mouth 2 (two) times daily. 60 tablet 0  . cloNIDine (CATAPRES) 0.2 MG tablet Take 1 tablet (0.2 mg total) by mouth 2 (two) times daily. 60 tablet 1  . hydrALAZINE (APRESOLINE) 50 MG tablet Take 1 tablet (50 mg total) by mouth 3 (three) times daily. 270 tablet 1  . losartan (COZAAR) 50 MG tablet Take 1 tablet (50 mg total) by mouth daily. 30 tablet 2   No facility-administered medications prior to visit.    No Known Allergies  ROS Review of Systems As per HPI    Objective:    Physical Exam Vitals reviewed.  Constitutional:      General: He is not in acute distress.    Appearance: Normal appearance. He is not ill-appearing.  HENT:     Head: Normocephalic and atraumatic.  Cardiovascular:     Rate and Rhythm: Normal rate and regular rhythm.  Pulmonary:     Effort: Pulmonary effort is normal. No respiratory distress.     Breath sounds: Normal breath sounds.  Abdominal:     General: Abdomen is flat. Bowel sounds are normal. There is no distension.     Palpations: Abdomen is soft.  Musculoskeletal:        General: Normal range of motion.     Cervical back: Normal range of motion.  Skin:    General: Skin is warm and dry.  Neurological:     General: No focal deficit present.     Mental Status: He is alert and oriented to person, place, and time.  Psychiatric:        Mood and Affect: Mood normal.        Behavior: Behavior normal.     BP (!) 166/73 (Patient Position: Sitting, Cuff Size: Normal)   Pulse 98   Temp (!) 97 F (36.1 C) (Skin)   Ht '5\' 5"'  (1.651 m)   Wt 176 lb 12.8 oz (80.2 kg)   SpO2 93%   BMI 29.42 kg/m   Wt Readings from Last 3 Encounters:  04/24/20 176 lb 12.8 oz (80.2 kg)  03/03/20 178 lb (80.7 kg)  02/28/20 178 lb 6.4 oz (80.9 kg)     Health Maintenance Due  Topic Date Due  . COVID-19 Vaccine (1) Never done  . HIV Screening  Never done  . TETANUS/TDAP  Never done  . COLONOSCOPY (Pts 45-49yr Insurance coverage will need to be confirmed)  Never done  . INFLUENZA VACCINE  Never done    There are no preventive care reminders to display for this patient.  Lab Results  Component Value Date   TSH 4.940 (H) 04/24/2020   Lab Results  Component Value Date   WBC 5.5 03/03/2020   HGB 13.7 03/03/2020   HCT 41.6 03/03/2020   MCV 92.7 03/03/2020   PLT 232 03/03/2020   Lab Results  Component Value Date   NA 141 04/24/2020   K 3.4 (L) 04/24/2020   CO2 25 04/24/2020   GLUCOSE 95 04/24/2020   BUN 24 04/24/2020   CREATININE 1.85 (H) 04/24/2020   BILITOT 0.3 04/24/2020   ALKPHOS 67 04/24/2020   AST 20 04/24/2020   ALT 28 04/24/2020   PROT 6.8 04/24/2020   ALBUMIN 4.4 04/24/2020   CALCIUM 9.3 04/24/2020   ANIONGAP 10 03/03/2020   Lab Results  Component  Value Date   CHOL 181 04/24/2020   Lab Results  Component Value Date   HDL 52 04/24/2020   Lab Results  Component Value Date   LDLCALC 105 (H) 04/24/2020   Lab Results  Component Value Date   TRIG 136 04/24/2020   Lab Results  Component Value Date   CHOLHDL 3.5 04/24/2020   Lab Results  Component Value Date   HGBA1C 5.7 (H) 04/24/2020      Assessment & Plan:   Problem List Items Addressed This Visit      Cardiovascular and Mediastinum   HTN (hypertension) (Chronic)    Hypertension today, 158/90 on manual repeat He ran out of clonidine Taking amlodipine, losartan, hydral.  Last creatinine was improved, planned to add on HCTZ, but creatinine on repeat is 1.8.  Called pt regarding changing meds -> stop HCTZ.  Start labetalol.  Decrease losartan to 25 mg and follow.      Relevant Medications    amLODipine (NORVASC) 10 MG tablet   aspirin EC 81 MG tablet   hydrALAZINE (APRESOLINE) 50 MG tablet   atorvastatin (LIPITOR) 40 MG tablet   losartan (COZAAR) 50 MG tablet   labetalol (NORMODYNE) 100 MG tablet   Vertebral artery disease ([place])    Recent ED visit with imaging that showed occlusion of R vertebral artery Vascular noted likely chronic finding per EDP note Recommended vascular surgery follow up outpatient       Relevant Medications   amLODipine (NORVASC) 10 MG tablet   aspirin EC 81 MG tablet   hydrALAZINE (APRESOLINE) 50 MG tablet   atorvastatin (LIPITOR) 40 MG tablet   losartan (COZAAR) 50 MG tablet   labetalol (NORMODYNE) 100 MG tablet   Other Relevant Orders   Ambulatory referral to Vascular Surgery     Nervous and Auditory   Cervical radiculopathy    ED visit in Dec for R neck/arm pain MRI with neural foraminal stenosis Symptoms currently resolved Follow up with neurology outpatient         Genitourinary   CKD (chronic kidney disease), stage III (HCC)    Baseline creatinine has fluctuated widely -> 1.5-1.68 (but around 2 previously) He's on ARB and was planning to start HCTZ Will need repeat labs at next visit   Creatinine is 1.85 today, decrease losartan to 25.  Follow creatinine at next visit.         Other   History of stroke - Primary    Imaging shows old right cerebellar infarct  Starting aspirin and statin  Follow A1c, lipid panel  Neurology follow up       Relevant Orders   Ambulatory referral to Neurology   Abnormal thyroid function test    Slightly elevated TSH Follow repeat TSH and follow free T4 at next visit.       Dyslipidemia    The 10-year ASCVD risk score [name], et al., 2013) is: 18.8%   Values used to calculate the score:     Age: 29 years     Sex: Male     Is Non-Hispanic African American: Yes     Diabetic: No     Tobacco smoker: No     Systolic Blood Pressure: 612 mmHg     Is BP treated: Yes     HDL  Cholesterol: 52 mg/dL     Total Cholesterol: 181 mg/dL  Hx stroke based on imaging, started atorvastatin/ASA      Relevant Medications   atorvastatin (LIPITOR) 40 MG tablet  Other Visit Diagnoses    Essential hypertension       Rx RF amlodipine for 3 months. Recent BMI WNl   Relevant Medications   amLODipine (NORVASC) 10 MG tablet   aspirin EC 81 MG tablet   hydrALAZINE (APRESOLINE) 50 MG tablet   atorvastatin (LIPITOR) 40 MG tablet   losartan (COZAAR) 50 MG tablet   labetalol (NORMODYNE) 100 MG tablet   Other Relevant Orders   Comp Met (CMET) (Completed)   HgB A1c (Completed)   Lipid Profile (Completed)   Basic Metabolic Panel (BMET)   TSH (Completed)      Meds ordered this encounter  Medications  . amLODipine (NORVASC) 10 MG tablet    Sig: Take 1 tablet (10 mg total) by mouth daily.    Dispense:  30 tablet    Refill:  2  . aspirin EC 81 MG tablet    Sig: Take 1 tablet (81 mg total) by mouth daily. Swallow whole.    Dispense:  150 tablet    Refill:  1  . hydrALAZINE (APRESOLINE) 50 MG tablet    Sig: Take 1 tablet (50 mg total) by mouth 3 (three) times daily.    Dispense:  90 tablet    Refill:  2  . DISCONTD: losartan (COZAAR) 50 MG tablet    Sig: Take 1 tablet (50 mg total) by mouth daily.    Dispense:  30 tablet    Refill:  2  . DISCONTD: hydrochlorothiazide (HYDRODIURIL) 25 MG tablet    Sig: Take 0.5 tablets (12.5 mg total) by mouth daily.    Dispense:  15 tablet    Refill:  0  . atorvastatin (LIPITOR) 40 MG tablet    Sig: Take 1 tablet (40 mg total) by mouth daily.    Dispense:  30 tablet    Refill:  0  . losartan (COZAAR) 50 MG tablet    Sig: Take 0.5 tablets (25 mg total) by mouth daily.    Dispense:  15 tablet    Refill:  2  . labetalol (NORMODYNE) 100 MG tablet    Sig: Take 1 tablet (100 mg total) by mouth 2 (two) times daily.    Dispense:  60 tablet    Refill:  2    Follow-up: Return in about 2 weeks (around 05/08/2020) for Blood pressure.     Fayrene Helper, MD

## 2020-04-24 NOTE — Patient Instructions (Signed)
Please follow up for repeat labs in about 2 weeks.   Please follow up with neurology and vascular surgery as an outpatinet.  We'll start you on aspirin, atorvastatin for your old stroke.    We'll start you on HCTZ for your blood prssure.

## 2020-04-25 DIAGNOSIS — I779 Disorder of arteries and arterioles, unspecified: Secondary | ICD-10-CM | POA: Insufficient documentation

## 2020-04-25 DIAGNOSIS — M5412 Radiculopathy, cervical region: Secondary | ICD-10-CM | POA: Insufficient documentation

## 2020-04-25 DIAGNOSIS — Z8673 Personal history of transient ischemic attack (TIA), and cerebral infarction without residual deficits: Secondary | ICD-10-CM | POA: Insufficient documentation

## 2020-04-25 LAB — LIPID PANEL
Chol/HDL Ratio: 3.5 ratio (ref 0.0–5.0)
Cholesterol, Total: 181 mg/dL (ref 100–199)
HDL: 52 mg/dL (ref >39)
LDL Chol Calc (NIH): 105 mg/dL — ABNORMAL HIGH (ref 0–99)
Triglycerides: 136 mg/dL (ref 0–149)
VLDL Cholesterol Cal: 24 mg/dL (ref 5–40)

## 2020-04-25 LAB — COMPREHENSIVE METABOLIC PANEL
ALT: 28 IU/L (ref 0–44)
AST: 20 IU/L (ref 0–40)
Albumin/Globulin Ratio: 1.8 (ref 1.2–2.2)
Albumin: 4.4 g/dL (ref 3.8–4.9)
Alkaline Phosphatase: 67 IU/L (ref 44–121)
BUN/Creatinine Ratio: 13 (ref 9–20)
BUN: 24 mg/dL (ref 6–24)
Bilirubin Total: 0.3 mg/dL (ref 0.0–1.2)
CO2: 25 mmol/L (ref 20–29)
Calcium: 9.3 mg/dL (ref 8.7–10.2)
Chloride: 103 mmol/L (ref 96–106)
Creatinine, Ser: 1.85 mg/dL — ABNORMAL HIGH (ref 0.76–1.27)
GFR calc Af Amer: 45 mL/min/{1.73_m2} — ABNORMAL LOW (ref >59)
GFR calc non Af Amer: 39 mL/min/{1.73_m2} — ABNORMAL LOW (ref >59)
Globulin, Total: 2.4 g/dL (ref 1.5–4.5)
Glucose: 95 mg/dL (ref 65–99)
Potassium: 3.4 mmol/L — ABNORMAL LOW (ref 3.5–5.2)
Sodium: 141 mmol/L (ref 134–144)
Total Protein: 6.8 g/dL (ref 6.0–8.5)

## 2020-04-25 LAB — HEMOGLOBIN A1C
Est. average glucose Bld gHb Est-mCnc: 117 mg/dL
Hgb A1c MFr Bld: 5.7 % — ABNORMAL HIGH (ref 4.8–5.6)

## 2020-04-25 LAB — TSH: TSH: 4.94 u[IU]/mL — ABNORMAL HIGH (ref 0.450–4.500)

## 2020-04-25 MED ORDER — LOSARTAN POTASSIUM 50 MG PO TABS: 25.0000 mg | ORAL_TABLET | Freq: Every day | ORAL | 2 refills | Status: DC

## 2020-04-25 MED ORDER — LABETALOL HCL 100 MG PO TABS: 100.0000 mg | ORAL_TABLET | Freq: Two times a day (BID) | ORAL | 2 refills | Status: DC

## 2020-04-25 NOTE — Assessment & Plan Note (Addendum)
Baseline creatinine has fluctuated widely -> 1.5-1.68 (but around 2 previously) He's on ARB and was planning to start HCTZ Will need repeat labs at next visit   Creatinine is 1.85 today, decrease losartan to 25.  Follow creatinine at next visit.

## 2020-04-25 NOTE — Assessment & Plan Note (Signed)
ED visit in Dec for R neck/arm pain MRI with neural foraminal stenosis Symptoms currently resolved Follow up with neurology outpatient

## 2020-04-25 NOTE — Assessment & Plan Note (Signed)
Recent ED visit with imaging that showed occlusion of R vertebral artery Vascular noted likely chronic finding per EDP note Recommended vascular surgery follow up outpatient

## 2020-04-25 NOTE — Assessment & Plan Note (Addendum)
Imaging shows old right cerebellar infarct  Starting aspirin and statin  Follow A1c, lipid panel  Neurology follow up

## 2020-04-25 NOTE — Assessment & Plan Note (Addendum)
Hypertension today, 158/90 on manual repeat He ran out of clonidine Taking amlodipine, losartan, hydral.  Last creatinine was improved, planned to add on HCTZ, but creatinine on repeat is 1.8.  Called pt regarding changing meds -> stop HCTZ.  Start labetalol.  Decrease losartan to 25 mg and follow.

## 2020-04-28 DIAGNOSIS — E785 Hyperlipidemia, unspecified: Secondary | ICD-10-CM | POA: Insufficient documentation

## 2020-04-28 DIAGNOSIS — R946 Abnormal results of thyroid function studies: Secondary | ICD-10-CM | POA: Insufficient documentation

## 2020-04-28 NOTE — Assessment & Plan Note (Signed)
Slightly elevated TSH Follow repeat TSH and follow free T4 at next visit.

## 2020-04-28 NOTE — Assessment & Plan Note (Signed)
The 10-year ASCVD risk score Mikey Bussing DC Brooke Bonito., et al., 2013) is: 18.8%   Values used to calculate the score:     Age: 60 years     Sex: Male     Is Non-Hispanic African American: Yes     Diabetic: No     Tobacco smoker: No     Systolic Blood Pressure: [id number] mmHg     Is BP treated: Yes     HDL Cholesterol: 52 mg/dL     Total Cholesterol: 181 mg/dL  Hx stroke based on imaging, started atorvastatin/ASA

## 2020-05-08 ENCOUNTER — Other Ambulatory Visit: Payer: Self-pay | Admitting: Gerontology

## 2020-05-08 DIAGNOSIS — N1831 Chronic kidney disease, stage 3a: Secondary | ICD-10-CM

## 2020-05-12 ENCOUNTER — Encounter (INDEPENDENT_AMBULATORY_CARE_PROVIDER_SITE_OTHER): Payer: Self-pay | Admitting: Vascular Surgery

## 2020-05-12 ENCOUNTER — Other Ambulatory Visit: Payer: Self-pay

## 2020-05-12 ENCOUNTER — Ambulatory Visit (INDEPENDENT_AMBULATORY_CARE_PROVIDER_SITE_OTHER): Payer: Self-pay | Admitting: Vascular Surgery

## 2020-05-12 VITALS — BP 171/87 | HR 86 | Resp 16 | Ht 65.5 in | Wt 171.2 lb

## 2020-05-12 DIAGNOSIS — I10 Essential (primary) hypertension: Secondary | ICD-10-CM

## 2020-05-12 DIAGNOSIS — E785 Hyperlipidemia, unspecified: Secondary | ICD-10-CM

## 2020-05-12 DIAGNOSIS — I779 Disorder of arteries and arterioles, unspecified: Secondary | ICD-10-CM

## 2020-05-12 DIAGNOSIS — M5412 Radiculopathy, cervical region: Secondary | ICD-10-CM

## 2020-05-12 NOTE — Progress Notes (Signed)
MRN : [id number]  Paul Howard is a 60 y.o. (February 08, 1961) male who presents with chief complaint of No chief complaint on file. Marland Kitchen  History of Present Illness:   The patient is seen for evaluation of vertebral stenosis. The  vertebral artery stenosis was identified after he went to the emergency room with neck pain and numbness of his right arm and shoulder.  MRI was obtained which demonstrated occlusion of the right vertebral artery the left vertebral artery and the basilar artery are widely patent the right and left carotid arteries are widely patent with minimal disease.  The patient denies amaurosis fugax. There is no recent history of TIA symptoms or focal motor deficits. There is a prior documented CVA.  There is no history of migraine headaches. There is no history of seizures.  The patient is taking enteric-coated aspirin 81 mg daily.  The patient has a history of coronary artery disease, no recent episodes of angina or shortness of breath. The patient denies PAD or claudication symptoms. There is a history of hyperlipidemia which is being treated with a statin.    No outpatient medications have been marked as taking for the 05/12/20 encounter (Appointment) with Delana Meyer, Dolores Lory, MD.    Past Medical History:  Diagnosis Date  . Acute epigastric pain 10/22/2014  . AKI (acute kidney injury) ([place]) 10/22/2014  . Arthritis   . Asthma   . HTN (hypertension) 10/22/2014  . Hypertension   . Pancreatitis   . Pancreatitis, alcoholic, acute [id number]    Past Surgical History:  Procedure Laterality Date  . TONSILLECTOMY      Social History Social History   Tobacco Use  . Smoking status: Never Smoker  . Smokeless tobacco: Never Used  Vaping Use  . Vaping Use: Never used  Substance Use Topics  . Alcohol use: Yes    Alcohol/week: 7.0 standard drinks    Types: 7 Cans of beer per week    Comment: 1 beer/day  . Drug use: Yes    Types: Marijuana    Comment: 2-3x/week     Family History Family History  Problem Relation Age of Onset  . Hypertension Mother   . Throat cancer Father   No family history of bleeding/clotting disorders, porphyria or autoimmune disease   No Known Allergies   REVIEW OF SYSTEMS (Negative unless checked)  Constitutional: '[]'$ Weight loss  '[]'$ Fever  '[]'$ Chills Cardiac: '[]'$ Chest pain   '[]'$ Chest pressure   '[]'$ Palpitations   '[]'$ Shortness of breath when laying flat   '[]'$ Shortness of breath with exertion. Vascular:  '[]'$ Pain in legs with walking   '[]'$ Pain in legs at rest  '[]'$ History of DVT   '[]'$ Phlebitis   '[]'$ Swelling in legs   '[]'$ Varicose veins   '[]'$ Non-healing ulcers Pulmonary:   '[]'$ Uses home oxygen   '[]'$ Productive cough   '[]'$ Hemoptysis   '[]'$ Wheeze  '[]'$ COPD   '[]'$ Asthma Neurologic:  '[]'$ Dizziness   '[]'$ Seizures   '[x]'$ History of stroke   '[]'$ History of TIA  '[]'$ Aphasia   '[]'$ Vissual changes   '[x]'$ Weakness or numbness in arm   '[]'$ Weakness or numbness in leg Musculoskeletal:   '[]'$ Joint swelling   '[]'$ Joint pain   '[]'$ Low back pain Hematologic:  '[]'$ Easy bruising  '[]'$ Easy bleeding   '[]'$ Hypercoagulable state   '[]'$ Anemic Gastrointestinal:  '[]'$ Diarrhea   '[]'$ Vomiting  '[]'$ Gastroesophageal reflux/heartburn   '[]'$ Difficulty swallowing. Genitourinary:  '[]'$ Chronic kidney disease   '[]'$ Difficult urination  '[]'$ Frequent urination   '[]'$ Blood in urine Skin:  '[]'$ Rashes   '[]'$ Ulcers  Psychological:  '[]'$ History of anxiety   '[]'$   History of major depression.  Physical Examination  There were no vitals filed for this visit. There is no height or weight on file to calculate BMI. Gen: WD/WN, NAD Head: [place]/AT, No temporalis wasting.  Ear/Nose/Throat: Hearing grossly intact, nares w/o erythema or drainage, poor dentition Eyes: PER, EOMI, sclera nonicteric.  Neck: Supple, no masses.  No bruit or JVD.  Pulmonary:  Good air movement, clear to auscultation bilaterally, no use of accessory muscles.  Cardiac: RRR, normal S1, S2, no Murmurs. Vascular: right carotid bruit Vessel Right Left  Radial Palpable Palpable   Carotid Palpable Palpable  Gastrointestinal: soft, non-distended. No guarding/no peritoneal signs.  Musculoskeletal: M/S 5/5 throughout.  No deformity or atrophy.  Neurologic: CN 2-12 intact. Pain and light touch intact in extremities.  Symmetrical.  Speech is fluent. Motor exam as listed above. Psychiatric: Judgment intact, Mood & affect appropriate for pt's clinical situation. Dermatologic: No rashes or ulcers noted.  No changes consistent with cellulitis.   CBC Lab Results  Component Value Date   WBC 5.5 03/03/2020   HGB 13.7 03/03/2020   HCT 41.6 03/03/2020   MCV 92.7 03/03/2020   PLT 232 03/03/2020    BMET    Component Value Date/Time   NA 141 04/24/2020 1857   NA 136 08/16/2013 0759   K 3.4 (L) 04/24/2020 1857   K 3.2 (L) 08/16/2013 0759   CL 103 04/24/2020 1857   CL 104 08/16/2013 0759   CO2 25 04/24/2020 1857   CO2 19 (L) 08/16/2013 0759   GLUCOSE 95 04/24/2020 1857   GLUCOSE 97 03/03/2020 1430   GLUCOSE 83 08/16/2013 0759   BUN 24 04/24/2020 1857   BUN 17 08/16/2013 0759   CREATININE 1.85 (H) 04/24/2020 1857   CREATININE 1.18 08/16/2013 0759   CALCIUM 9.3 04/24/2020 1857   CALCIUM 8.7 08/16/2013 0759   GFRNONAA 39 (L) 04/24/2020 1857   GFRNONAA >60 03/03/2020 1430   GFRNONAA >60 08/16/2013 0759   GFRAA 45 (L) 04/24/2020 1857   GFRAA >60 08/16/2013 0759   CrCl cannot be calculated (Unknown ideal weight.).  COAG No results found for: INR, PROTIME  Radiology No results found.   Assessment/Plan 1. Vertebral artery disease (HCC) Recommend:  Given the patient's asymptomatic subcritical stenosis no further invasive testing or surgery at this time.  MRA shows <30% ICA stenosis bilaterally, minimal left vertebral stenosis and right vertebral occlusion.  Continue antiplatelet therapy as prescribed Continue management of CAD, HTN and Hyperlipidemia Healthy heart diet,  encouraged exercise at least 4 times per week.  Follow up in 6 months with duplex  ultrasound and physical exam.   - VAS US CAROTID; Future  2. Primary hypertension Continue antihypertensive medications as already ordered, these medications have been reviewed and there are no changes at this time.   3. Cervical radiculopathy Continue NSAID medications as already ordered, these medications have been reviewed and there are no changes at this time.  Continued activity and therapy was stressed.  4. Dyslipidemia Continue statin as ordered and reviewed, no changes at this time     Hortencia Pilar, MD  05/12/2020 9:53 AM

## 2020-05-15 ENCOUNTER — Other Ambulatory Visit: Payer: Self-pay

## 2020-05-15 ENCOUNTER — Ambulatory Visit: Payer: Self-pay | Admitting: Family Medicine

## 2020-05-15 VITALS — BP 161/86 | HR 94 | Temp 97.2°F | Wt 177.6 lb

## 2020-05-15 DIAGNOSIS — I10 Essential (primary) hypertension: Secondary | ICD-10-CM

## 2020-05-15 MED ORDER — LABETALOL HCL 100 MG PO TABS: 100.0000 mg | ORAL_TABLET | Freq: Two times a day (BID) | ORAL | 2 refills | Status: DC

## 2020-05-15 MED ORDER — LOSARTAN POTASSIUM 25 MG PO TABS: 25.0000 mg | ORAL_TABLET | Freq: Every day | ORAL | 2 refills | Status: DC

## 2020-05-15 NOTE — Patient Instructions (Signed)

## 2020-05-15 NOTE — Progress Notes (Signed)
  [facility]   Progress Note: General Provider: Lanae Boast, FNP  SUBJECTIVE:   Paul Howard is a 60 y.o. male who  has a past medical history of Acute epigastric pain (10/22/2014), AKI (acute kidney injury) ([place]) (10/22/2014), Arthritis, Asthma, HTN (hypertension) (10/22/2014), Hypertension, Pancreatitis, and Pancreatitis, alcoholic, acute ([id number]).. Patient presents today for No chief complaint on file.  Patient states that he was unable to get the Labatelol due to cost and is requesting a 30 days supply of medication instead of 90. He reports compliance with his medications.  He has decreased losartan and d/c'd HCTZ as previously instructed.  Was seen by vascular surgery on 05/12/20: Given the patient's asymptomatic subcritical stenosis no further invasive testing or surgery at this time.   Review of Systems  Constitutional: Negative.   HENT: Negative.   Eyes: Negative.   Respiratory: Negative.   Cardiovascular: Negative.   Gastrointestinal: Negative.   Genitourinary: Negative.   Musculoskeletal: Negative.   Skin: Negative.   Neurological: Negative.   Psychiatric/Behavioral: Negative.      OBJECTIVE: BP (!) 161/86   Pulse 94   Temp (!) 97.2 F (36.2 C)   Wt 177 lb 9.6 oz (80.6 kg)   SpO2 99%   BMI 29.10 kg/m   Wt Readings from Last 3 Encounters:  05/15/20 177 lb 9.6 oz (80.6 kg)  05/12/20 171 lb 3.2 oz (77.7 kg)  04/24/20 176 lb 12.8 oz (80.2 kg)     Physical Exam Vitals and nursing note reviewed.  Constitutional:      General: He is not in acute distress.    Appearance: Normal appearance.  HENT:     Head: Normocephalic and atraumatic.  Eyes:     Extraocular Movements: Extraocular movements intact.     Conjunctiva/sclera: Conjunctivae normal.     Pupils: Pupils are equal, round, and reactive to light.  Cardiovascular:     Rate and Rhythm: Normal rate and regular rhythm.     Heart sounds: No murmur heard.   Pulmonary:     Effort:  Pulmonary effort is normal.     Breath sounds: Normal breath sounds.  Musculoskeletal:        General: Normal range of motion.  Skin:    General: Skin is warm and dry.  Neurological:     Mental Status: He is alert and oriented to person, place, and time.  Psychiatric:        Mood and Affect: Mood normal.        Behavior: Behavior normal.        Thought Content: Thought content normal.        Judgment: Judgment normal.     ASSESSMENT/PLAN:  1. Essential hypertension - Comprehensive metabolic panel; Future - losartan (COZAAR) 25 MG tablet; Take 1 tablet (25 mg total) by mouth daily.  Dispense: 30 tablet; Refill: 2  Return in about 4 weeks (around 06/12/2020), or labs 1 week prior, for HTN.    The patient was given clear instructions to go to ER or return to medical center if symptoms do not improve, worsen or new problems develop. The patient verbalized understanding and agreed with plan of care.   Ms. Doug Sou. Nathaneil Canary, FNP-BC [place]

## 2020-06-05 ENCOUNTER — Other Ambulatory Visit: Payer: Self-pay

## 2020-06-05 DIAGNOSIS — I10 Essential (primary) hypertension: Secondary | ICD-10-CM

## 2020-06-06 LAB — COMPREHENSIVE METABOLIC PANEL
ALT: 27 IU/L (ref 0–44)
AST: 20 IU/L (ref 0–40)
Albumin/Globulin Ratio: 1.4 (ref 1.2–2.2)
Albumin: 4.3 g/dL (ref 3.8–4.9)
Alkaline Phosphatase: 70 IU/L (ref 44–121)
BUN/Creatinine Ratio: 15 (ref 9–20)
BUN: 24 mg/dL (ref 6–24)
Bilirubin Total: 0.3 mg/dL (ref 0.0–1.2)
CO2: 22 mmol/L (ref 20–29)
Calcium: 9.2 mg/dL (ref 8.7–10.2)
Chloride: 100 mmol/L (ref 96–106)
Creatinine, Ser: 1.62 mg/dL — ABNORMAL HIGH (ref 0.76–1.27)
Globulin, Total: 3 g/dL (ref 1.5–4.5)
Glucose: 91 mg/dL (ref 65–99)
Potassium: 3.5 mmol/L (ref 3.5–5.2)
Sodium: 137 mmol/L (ref 134–144)
Total Protein: 7.3 g/dL (ref 6.0–8.5)
eGFR: 49 mL/min/{1.73_m2} — ABNORMAL LOW (ref >59)

## 2020-06-12 ENCOUNTER — Ambulatory Visit: Payer: Self-pay

## 2020-06-26 ENCOUNTER — Other Ambulatory Visit: Payer: Self-pay

## 2020-06-26 ENCOUNTER — Ambulatory Visit: Payer: Self-pay | Admitting: Family Medicine

## 2020-06-26 VITALS — BP 168/95 | HR 92 | Temp 98.1°F | Ht 65.0 in | Wt 172.3 lb

## 2020-06-26 DIAGNOSIS — N1832 Chronic kidney disease, stage 3b: Secondary | ICD-10-CM

## 2020-06-26 DIAGNOSIS — I10 Essential (primary) hypertension: Secondary | ICD-10-CM

## 2020-06-26 MED ORDER — LOSARTAN POTASSIUM 50 MG PO TABS: 50.0000 mg | ORAL_TABLET | Freq: Every day | ORAL | 1 refills | Status: DC

## 2020-06-26 NOTE — Progress Notes (Signed)
Established Patient Office Visit  Subjective:  Patient ID: Paul Howard, male    DOB: [birth year]  Age: 60 y.o. MRN: [id number]  CC:  Chief Complaint  Patient presents with  . Blood Pressure Check     HPI Paul Howard presents for F/U bp. fEELS WELL.  Past Medical History:  Diagnosis Date  . Acute epigastric pain 10/22/2014  . AKI (acute kidney injury) ([place]) 10/22/2014  . Arthritis   . Asthma   . HTN (hypertension) 10/22/2014  . Hypertension   . Pancreatitis   . Pancreatitis, alcoholic, acute 06/28/163    Past Surgical History:  Procedure Laterality Date  . TONSILLECTOMY      Family History  Problem Relation Age of Onset  . Hypertension Mother   . Throat cancer Father     Social History   Socioeconomic History  . Marital status: Single    Spouse name: Not on file  . Number of children: Not on file  . Years of education: 76  . Highest education level: 11th grade  Occupational History  . Occupation: Contractor  Tobacco Use  . Smoking status: Never Smoker  . Smokeless tobacco: Never Used  Vaping Use  . Vaping Use: Never used  Substance and Sexual Activity  . Alcohol use: Yes    Alcohol/week: 7.0 standard drinks    Types: 7 Cans of beer per week    Comment: 1 beer/day  . Drug use: Yes    Types: Marijuana    Comment: 2-3x/week  . Sexual activity: Not on file  Other Topics Concern  . Not on file  Social History Narrative   Not on food stamps, hasn't applied. No transportation. Walks everywhere. Close to work, but far from paying rent and grocery.   Social Determinants of Health   Financial Resource Strain: Not on file  Food Insecurity: Not on file  Transportation Needs: Not on file  Physical Activity: Inactive  . Days of Exercise per Week: 0 days  . Minutes of Exercise per Session: 0 min  Stress: No Stress Concern Present  . Feeling of Stress : Not at all  Social Connections: Not on file  Intimate Partner Violence: Not At Risk  . Fear of  Current or Ex-Partner: No  . Emotionally Abused: No  . Physically Abused: No  . Sexually Abused: No    Outpatient Medications Prior to Visit  Medication Sig Dispense Refill  . acetaminophen (TYLENOL) 650 MG CR tablet Take 650 mg by mouth every 8 (eight) hours as needed for pain (arthritis).    Marland Kitchen amLODipine (NORVASC) 10 MG tablet Take 1 tablet (10 mg total) by mouth daily. 30 tablet 2  . aspirin EC 81 MG tablet Take 1 tablet (81 mg total) by mouth daily. Swallow whole. 150 tablet 1  . Blood Pressure KIT 1 kit by Does not apply route daily. 1 kit 0  . losartan (COZAAR) 25 MG tablet Take 1 tablet (25 mg total) by mouth daily. 30 tablet 2  . atorvastatin (LIPITOR) 40 MG tablet Take 1 tablet (40 mg total) by mouth daily. 30 tablet 0  . hydrALAZINE (APRESOLINE) 50 MG tablet Take 1 tablet (50 mg total) by mouth 3 (three) times daily. 90 tablet 2  . labetalol (NORMODYNE) 100 MG tablet Take 1 tablet (100 mg total) by mouth 2 (two) times daily. (Patient not taking: Reported on 06/26/2020) 60 tablet 2   No facility-administered medications prior to visit.    No Known Allergies  ROS  Review of Systems    Objective:    Physical Exam Vitals reviewed.  Constitutional:      Appearance: He is well-developed.  HENT:     Head: Normocephalic and atraumatic.  Eyes:     Pupils: Pupils are equal, round, and reactive to light.  Cardiovascular:     Rate and Rhythm: Normal rate and regular rhythm.  Pulmonary:     Effort: Pulmonary effort is normal.     Breath sounds: Normal breath sounds.  Skin:    General: Skin is warm and dry.  Neurological:     General: No focal deficit present.     Mental Status: He is alert and oriented to person, place, and time.  Psychiatric:        Mood and Affect: Mood normal.        Behavior: Behavior normal.        Thought Content: Thought content normal.        Judgment: Judgment normal.     BP (!) 168/95   Pulse 92   Temp 98.1 F (36.7 C)   Ht _0  (1.651  m)   Wt 172 lb 4.8 oz (78.2 kg)   SpO2 100%   BMI 28.67 kg/m  Wt Readings from Last 3 Encounters:  06/26/20 172 lb 4.8 oz (78.2 kg)  05/15/20 177 lb 9.6 oz (80.6 kg)  05/12/20 171 lb 3.2 oz (77.7 kg)     Health Maintenance Due  Topic Date Due  . COVID-19 Vaccine (1) Never done  . HIV Screening  Never done  . TETANUS/TDAP  Never done  . COLONOSCOPY (Pts 45-29yr Insurance coverage will need to be confirmed)  Never done    There are no preventive care reminders to display for this patient.  Lab Results  Component Value Date   TSH 4.940 (H) 04/24/2020   Lab Results  Component Value Date   WBC 5.5 03/03/2020   HGB 13.7 03/03/2020   HCT 41.6 03/03/2020   MCV 92.7 03/03/2020   PLT 232 03/03/2020   Lab Results  Component Value Date   NA 137 06/05/2020   K 3.5 06/05/2020   CO2 22 06/05/2020   GLUCOSE 91 06/05/2020   BUN 24 06/05/2020   CREATININE 1.62 (H) 06/05/2020   BILITOT 0.3 06/05/2020   ALKPHOS 70 06/05/2020   AST 20 06/05/2020   ALT 27 06/05/2020   PROT 7.3 06/05/2020   ALBUMIN 4.3 06/05/2020   CALCIUM 9.2 06/05/2020   ANIONGAP 10 03/03/2020   Lab Results  Component Value Date   CHOL 181 04/24/2020   Lab Results  Component Value Date   HDL 52 04/24/2020   Lab Results  Component Value Date   LDLCALC 105 (H) 04/24/2020   Lab Results  Component Value Date   TRIG 136 04/24/2020   Lab Results  Component Value Date   CHOLHDL 3.5 04/24/2020   Lab Results  Component Value Date   HGBA1C 5.7 (H) 04/24/2020      Assessment & Plan:   Problem List Items Addressed This Visit   None     No orders of the defined types were placed in this encounter.   Follow-up: No follow-ups on file.    RWilhemena Durie MD

## 2020-09-25 ENCOUNTER — Other Ambulatory Visit: Payer: Self-pay

## 2020-09-25 ENCOUNTER — Ambulatory Visit: Payer: Self-pay | Admitting: Gerontology

## 2020-09-25 VITALS — BP 134/82 | HR 94 | Temp 97.3°F | Resp 16 | Ht 65.0 in | Wt 170.0 lb

## 2020-09-25 DIAGNOSIS — I10 Essential (primary) hypertension: Secondary | ICD-10-CM

## 2020-09-25 MED ORDER — AMLODIPINE BESYLATE 10 MG PO TABS: 10.0000 mg | ORAL_TABLET | Freq: Every day | ORAL | 2 refills | Status: DC

## 2020-09-25 NOTE — Progress Notes (Signed)
Established Patient Office Visit  Subjective:  Patient ID: Paul Howard, male    DOB: December 25, 1960  Age: 60 y.o. MRN: [id number]  CC: No chief complaint on file.   HPI KENON DELASHMIT is a 60 y/o male with PMH of Stage 3 CKD, Asthma, HTN,presents for follow up of hypertension and medication refill. He states that he's compliant with his medication, continues to make healthy lifestyle changes. He does not check his blood pressure at home. He was seen by Dr Lavonia Dana on 06/03/20 for Stage 3 CKD. He denies chest pain, palpitation, light headedness, and vision changes. Overall, he states that he's doing well and offers no further complaint.  Past Medical History:  Diagnosis Date   Acute epigastric pain 10/22/2014   AKI (acute kidney injury) ([place]) 10/22/2014   Arthritis    Asthma    HTN (hypertension) 10/22/2014   Hypertension    Pancreatitis    Pancreatitis, alcoholic, acute 08/25/628    Past Surgical History:  Procedure Laterality Date   TONSILLECTOMY      Family History  Problem Relation Age of Onset   Hypertension Mother    Throat cancer Father     Social History   Socioeconomic History   Marital status: Single    Spouse name: Not on file   Number of children: Not on file   Years of education: 11   Highest education level: 11th grade  Occupational History   Occupation: Contractor  Tobacco Use   Smoking status: Never   Smokeless tobacco: Never  Vaping Use   Vaping Use: Never used  Substance and Sexual Activity   Alcohol use: Yes    Alcohol/week: 7.0 standard drinks    Types: 7 Cans of beer per week    Comment: 1 beer/day   Drug use: Not Currently    Types: Marijuana    Comment: 2-3x/week   Sexual activity: Not on file  Other Topics Concern   Not on file  Social History Narrative   Not on food stamps, hasn't applied. No transportation. Walks everywhere. Close to work, but far from paying rent and grocery.   Social Determinants of Health   Financial  Resource Strain: Not on file  Food Insecurity: Not on file  Transportation Needs: Not on file  Physical Activity: Inactive   Days of Exercise per Week: 0 days   Minutes of Exercise per Session: 0 min  Stress: No Stress Concern Present   Feeling of Stress : Not at all  Social Connections: Not on file  Intimate Partner Violence: Not At Risk   Fear of Current or Ex-Partner: No   Emotionally Abused: No   Physically Abused: No   Sexually Abused: No    Outpatient Medications Prior to Visit  Medication Sig Dispense Refill   acetaminophen (TYLENOL) 650 MG CR tablet Take 650 mg by mouth every 8 (eight) hours as needed for pain (arthritis).     aspirin EC 81 MG tablet Take 1 tablet (81 mg total) by mouth daily. Swallow whole. 150 tablet 1   Blood Pressure KIT 1 kit by Does not apply route daily. 1 kit 0   losartan (COZAAR) 50 MG tablet Take 1 tablet (50 mg total) by mouth daily. 90 tablet 1   atorvastatin (LIPITOR) 40 MG tablet Take 1 tablet (40 mg total) by mouth daily. 30 tablet 0   hydrALAZINE (APRESOLINE) 50 MG tablet Take 1 tablet (50 mg total) by mouth 3 (three) times daily. 90 tablet 2  amLODipine (NORVASC) 10 MG tablet Take 1 tablet (10 mg total) by mouth daily. 30 tablet 2   labetalol (NORMODYNE) 100 MG tablet Take 1 tablet (100 mg total) by mouth 2 (two) times daily. (Patient not taking: Reported on 06/26/2020) 60 tablet 2   No facility-administered medications prior to visit.    No Known Allergies  ROS Review of Systems  Constitutional: Negative.   Eyes: Negative.   Respiratory: Negative.    Cardiovascular: Negative.   Endocrine: Negative.   Neurological: Negative.   Psychiatric/Behavioral: Negative.       Objective:    Physical Exam HENT:     Head: Normocephalic and atraumatic.     Mouth/Throat:     Mouth: Mucous membranes are moist.  Eyes:     Extraocular Movements: Extraocular movements intact.     Conjunctiva/sclera: Conjunctivae normal.     Pupils: Pupils are  equal, round, and reactive to light.  Cardiovascular:     Rate and Rhythm: Normal rate and regular rhythm.     Pulses: Normal pulses.     Heart sounds: Normal heart sounds.  Pulmonary:     Effort: Pulmonary effort is normal.     Breath sounds: Normal breath sounds.  Skin:    General: Skin is warm.  Neurological:     General: No focal deficit present.     Mental Status: He is alert and oriented to person, place, and time. Mental status is at baseline.  Psychiatric:        Mood and Affect: Mood normal.        Behavior: Behavior normal.        Thought Content: Thought content normal.        Judgment: Judgment normal.    BP 134/82 (BP Location: Right Arm, Patient Position: Sitting, Cuff Size: Large)   Pulse 94   Temp (!) 97.3 F (36.3 C)   Resp 16   Ht _0  (1.651 m)   Wt 170 lb (77.1 kg)   BMI 28.29 kg/m  Wt Readings from Last 3 Encounters:  09/25/20 170 lb (77.1 kg)  06/26/20 172 lb 4.8 oz (78.2 kg)  05/15/20 177 lb 9.6 oz (80.6 kg)     Health Maintenance Due  Topic Date Due   COVID-19 Vaccine (1) Never done   Pneumococcal Vaccine 8-62 Years old (1 - PCV) Never done   HIV Screening  Never done   TETANUS/TDAP  Never done   Zoster Vaccines- Shingrix (1 of 2) Never done    There are no preventive care reminders to display for this patient.  Lab Results  Component Value Date   TSH 4.940 (H) 04/24/2020   Lab Results  Component Value Date   WBC 5.5 03/03/2020   HGB 13.7 03/03/2020   HCT 41.6 03/03/2020   MCV 92.7 03/03/2020   PLT 232 03/03/2020   Lab Results  Component Value Date   NA 137 06/05/2020   K 3.5 06/05/2020   CO2 22 06/05/2020   GLUCOSE 91 06/05/2020   BUN 24 06/05/2020   CREATININE 1.62 (H) 06/05/2020   BILITOT 0.3 06/05/2020   ALKPHOS 70 06/05/2020   AST 20 06/05/2020   ALT 27 06/05/2020   PROT 7.3 06/05/2020   ALBUMIN 4.3 06/05/2020   CALCIUM 9.2 06/05/2020   ANIONGAP 10 03/03/2020   EGFR 49 (L) 06/05/2020   Lab Results  Component  Value Date   CHOL 181 04/24/2020   Lab Results  Component Value Date   HDL 52 04/24/2020  Lab Results  Component Value Date   LDLCALC 105 (H) 04/24/2020   Lab Results  Component Value Date   TRIG 136 04/24/2020   Lab Results  Component Value Date   CHOLHDL 3.5 04/24/2020   Lab Results  Component Value Date   HGBA1C 5.7 (H) 04/24/2020      Assessment & Plan:     1. Essential hypertension -His blood pressure is improving, and almost at his goal which should be less than 130/80.  -Low salt DASH diet -Take medications regularly on time -Exercise regularly as tolerated -Check blood pressure at least once a week at home or a nearby pharmacy and record -Goal is less than 130/80 and normal blood pressure is 120/80 -He will continue to follow up with Dr Juleen China at [facility]. - amLODipine (NORVASC) 10 MG tablet; Take 1 tablet (10 mg total) by mouth daily.  Dispense: 30 tablet; Refill: 2    Follow-up: Return in about 12 weeks (around 12/18/2020), or if symptoms worsen or fail to improve.    Yarenis Cerino Jerold Coombe, NP

## 2020-09-25 NOTE — Patient Instructions (Signed)
https://www.nhlbi.nih.gov/files/docs/public/heart/dash_brief.pdf">  DASH Eating Plan DASH stands for Dietary Approaches to Stop Hypertension. The DASH eating plan is a healthy eating plan that has been shown to: Reduce high blood pressure (hypertension). Reduce your risk for type 2 diabetes, heart disease, and stroke. Help with weight loss. What are tips for following this plan? Reading food labels Check food labels for the amount of salt (sodium) per serving. Choose foods with less than 5 percent of the Daily Value of sodium. Generally, foods with less than 300 milligrams (mg) of sodium per serving fit into this eating plan. To find whole grains, look for the word "whole" as the first word in the ingredient list. Shopping Buy products labeled as "low-sodium" or "no salt added." Buy fresh foods. Avoid canned foods and pre-made or frozen meals. Cooking Avoid adding salt when cooking. Use salt-free seasonings or herbs instead of table salt or sea salt. Check with your health care provider or pharmacist before using salt substitutes. Do not fry foods. Cook foods using healthy methods such as baking, boiling, grilling, roasting, and broiling instead. Cook with heart-healthy oils, such as olive, canola, avocado, soybean, or sunflower oil. Meal planning  Eat a balanced diet that includes: 4 or more servings of fruits and 4 or more servings of vegetables each day. Try to fill one-half of your plate with fruits and vegetables. 6-8 servings of whole grains each day. Less than 6 oz (170 g) of lean meat, poultry, or fish each day. A 3-oz (85-g) serving of meat is about the same size as a deck of cards. One egg equals 1 oz (28 g). 2-3 servings of low-fat dairy each day. One serving is 1 cup (237 mL). 1 serving of nuts, seeds, or beans 5 times each week. 2-3 servings of heart-healthy fats. Healthy fats called omega-3 fatty acids are found in foods such as walnuts, flaxseeds, fortified milks, and eggs.  These fats are also found in cold-water fish, such as sardines, salmon, and mackerel. Limit how much you eat of: Canned or prepackaged foods. Food that is high in trans fat, such as some fried foods. Food that is high in saturated fat, such as fatty meat. Desserts and other sweets, sugary drinks, and other foods with added sugar. Full-fat dairy products. Do not salt foods before eating. Do not eat more than 4 egg yolks a week. Try to eat at least 2 vegetarian meals a week. Eat more home-cooked food and less restaurant, buffet, and fast food.  Lifestyle When eating at a restaurant, ask that your food be prepared with less salt or no salt, if possible. If you drink alcohol: Limit how much you use to: 0-1 drink a day for women who are not pregnant. 0-2 drinks a day for men. Be aware of how much alcohol is in your drink. In the [place] one drink equals one 12 oz bottle of beer (355 mL), one 5 oz glass of wine (148 mL), or one 1 oz glass of hard liquor (44 mL). General information Avoid eating more than 2,300 mg of salt a day. If you have hypertension, you may need to reduce your sodium intake to 1,500 mg a day. Work with your health care provider to maintain a healthy body weight or to lose weight. Ask what an ideal weight is for you. Get at least 30 minutes of exercise that causes your heart to beat faster (aerobic exercise) most days of the week. Activities may include walking, swimming, or biking. Work with your health care provider   or dietitian to adjust your eating plan to your individual calorie needs. What foods should I eat? Fruits All fresh, dried, or frozen fruit. Canned fruit in natural juice (without addedsugar). Vegetables Fresh or frozen vegetables (raw, steamed, roasted, or grilled). Low-sodium or reduced-sodium tomato and vegetable juice. Low-sodium or reduced-sodium tomatosauce and tomato paste. Low-sodium or reduced-sodium canned vegetables. Grains Whole-grain or  whole-wheat bread. Whole-grain or whole-wheat pasta. Brown rice. Oatmeal. Quinoa. Bulgur. Whole-grain and low-sodium cereals. Pita bread.Low-fat, low-sodium crackers. Whole-wheat flour tortillas. Meats and other proteins Skinless chicken or turkey. Ground chicken or turkey. Pork with fat trimmed off. Fish and seafood. Egg whites. Dried beans, peas, or lentils. Unsalted nuts, nut butters, and seeds. Unsalted canned beans. Lean cuts of beef with fat trimmed off. Low-sodium, lean precooked or cured meat, such as sausages or meatloaves. Dairy Low-fat (1%) or fat-free (skim) milk. Reduced-fat, low-fat, or fat-free cheeses. Nonfat, low-sodium ricotta or cottage cheese. Low-fat or nonfatyogurt. Low-fat, low-sodium cheese. Fats and oils Soft margarine without trans fats. Vegetable oil. Reduced-fat, low-fat, or light mayonnaise and salad dressings (reduced-sodium). Canola, safflower, olive, avocado, soybean, andsunflower oils. Avocado. Seasonings and condiments Herbs. Spices. Seasoning mixes without salt. Other foods Unsalted popcorn and pretzels. Fat-free sweets. The items listed above may not be a complete list of foods and beverages you can eat. Contact a dietitian for more information. What foods should I avoid? Fruits Canned fruit in a light or heavy syrup. Fried fruit. Fruit in cream or buttersauce. Vegetables Creamed or fried vegetables. Vegetables in a cheese sauce. Regular canned vegetables (not low-sodium or reduced-sodium). Regular canned tomato sauce and paste (not low-sodium or reduced-sodium). Regular tomato and vegetable juice(not low-sodium or reduced-sodium). Pickles. Olives. Grains Baked goods made with fat, such as croissants, muffins, or some breads. Drypasta or rice meal packs. Meats and other proteins Fatty cuts of meat. Ribs. Fried meat. Bacon. Bologna, salami, and other precooked or cured meats, such as sausages or meat loaves. Fat from the back of a pig (fatback). Bratwurst.  Salted nuts and seeds. Canned beans with added salt. Canned orsmoked fish. Whole eggs or egg yolks. Chicken or turkey with skin. Dairy Whole or 2% milk, cream, and half-and-half. Whole or full-fat cream cheese. Whole-fat or sweetened yogurt. Full-fat cheese. Nondairy creamers. Whippedtoppings. Processed cheese and cheese spreads. Fats and oils Butter. Stick margarine. Lard. Shortening. Ghee. Bacon fat. Tropical oils, suchas coconut, palm kernel, or palm oil. Seasonings and condiments Onion salt, garlic salt, seasoned salt, table salt, and sea salt. Worcestershire sauce. Tartar sauce. Barbecue sauce. Teriyaki sauce. Soy sauce, including reduced-sodium. Steak sauce. Canned and packaged gravies. Fish sauce. Oyster sauce. Cocktail sauce. Store-bought horseradish. Ketchup. Mustard. Meat flavorings and tenderizers. Bouillon cubes. Hot sauces. Pre-made or packaged marinades. Pre-made or packaged taco seasonings. Relishes. Regular saladdressings. Other foods Salted popcorn and pretzels. The items listed above may not be a complete list of foods and beverages you should avoid. Contact a dietitian for more information. Where to find more information National Heart, Lung, and Blood Institute: www.nhlbi.nih.gov American Heart Association: www.heart.org Academy of Nutrition and Dietetics: www.eatright.org National Kidney Foundation: www.kidney.org Summary The DASH eating plan is a healthy eating plan that has been shown to reduce high blood pressure (hypertension). It may also reduce your risk for type 2 diabetes, heart disease, and stroke. When on the DASH eating plan, aim to eat more fresh fruits and vegetables, whole grains, lean proteins, low-fat dairy, and heart-healthy fats. With the DASH eating plan, you should limit salt (sodium) intake to 2,300   mg a day. If you have hypertension, you may need to reduce your sodium intake to 1,500 mg a day. Work with your health care provider or dietitian to adjust  your eating plan to your individual calorie needs. This information is not intended to replace advice given to you by your health care provider. Make sure you discuss any questions you have with your healthcare provider. Document Revised: 02/09/2019 Document Reviewed: 02/09/2019 Elsevier Patient Education  2022 [employer] Inc.  

## 2020-11-07 ENCOUNTER — Other Ambulatory Visit (INDEPENDENT_AMBULATORY_CARE_PROVIDER_SITE_OTHER): Payer: Self-pay | Admitting: Vascular Surgery

## 2020-11-07 DIAGNOSIS — I779 Disorder of arteries and arterioles, unspecified: Secondary | ICD-10-CM

## 2020-11-09 NOTE — Progress Notes (Signed)
MRN : [id number]  Paul Howard is a 60 y.o. (1960-11-25) male who presents with chief complaint of carotid artery blockages.  History of Present Illness:   The patient is seen for evaluation of vertebral stenosis. The  vertebral artery stenosis was identified after he went to the emergency room with neck pain and numbness of his right arm and shoulder.  MRI was obtained which demonstrated occlusion of the right vertebral artery the left vertebral artery and the basilar artery are widely patent the right and left carotid arteries are widely patent with minimal disease.   The patient denies amaurosis fugax. There is no recent history of TIA symptoms or focal motor deficits. There is a prior documented CVA.   There is no history of migraine headaches. There is no history of seizures.   The patient is taking enteric-coated aspirin 81 mg daily.   The patient has a history of coronary artery disease, no recent episodes of angina or shortness of breath. The patient denies PAD or claudication symptoms. There is a history of hyperlipidemia which is being treated with a statin.   Duplex ultrasound of the carotid and vertebral arteries shows 123456 and LICA [id number], there is antegrade flow in the left vertebral artery and known occlusion of the right vertebral artery is again demonstrated.  No outpatient medications have been marked as taking for the 11/10/20 encounter (Appointment) with Delana Meyer, Dolores Lory, MD.    Past Medical History:  Diagnosis Date   Acute epigastric pain 10/22/2014   AKI (acute kidney injury) ([place]) 10/22/2014   Arthritis    Asthma    HTN (hypertension) 10/22/2014   Hypertension    Pancreatitis    Pancreatitis, alcoholic, acute [id number]    Past Surgical History:  Procedure Laterality Date   TONSILLECTOMY      Social History Social History   Tobacco Use   Smoking status: Never   Smokeless tobacco: Never  Vaping Use   Vaping Use: Never used  Substance Use  Topics   Alcohol use: Yes    Alcohol/week: 7.0 standard drinks    Types: 7 Cans of beer per week    Comment: 1 beer/day   Drug use: Not Currently    Types: Marijuana    Comment: 2-3x/week    Family History Family History  Problem Relation Age of Onset   Hypertension Mother    Throat cancer Father     No Known Allergies   REVIEW OF SYSTEMS (Negative unless checked)  Constitutional: '[]'$ Weight loss  '[]'$ Fever  '[]'$ Chills Cardiac: '[]'$ Chest pain   '[]'$ Chest pressure   '[]'$ Palpitations   '[]'$ Shortness of breath when laying flat   '[]'$ Shortness of breath with exertion. Vascular:  '[]'$ Pain in legs with walking   '[]'$ Pain in legs at rest  '[]'$ History of DVT   '[]'$ Phlebitis   '[]'$ Swelling in legs   '[]'$ Varicose veins   '[]'$ Non-healing ulcers Pulmonary:   '[]'$ Uses home oxygen   '[]'$ Productive cough   '[]'$ Hemoptysis   '[]'$ Wheeze  '[]'$ COPD   '[]'$ Asthma Neurologic:  '[]'$ Dizziness   '[]'$ Seizures   '[]'$ History of stroke   '[]'$ History of TIA  '[]'$ Aphasia   '[]'$ Vissual changes   '[]'$ Weakness or numbness in arm   '[]'$ Weakness or numbness in leg Musculoskeletal:   '[]'$ Joint swelling   '[]'$ Joint pain   '[]'$ Low back pain Hematologic:  '[]'$ Easy bruising  '[]'$ Easy bleeding   '[]'$ Hypercoagulable state   '[]'$ Anemic Gastrointestinal:  '[]'$ Diarrhea   '[]'$ Vomiting  '[]'$ Gastroesophageal reflux/heartburn   '[]'$ Difficulty swallowing. Genitourinary:  '[]'$ Chronic kidney disease   '[]'$ Difficult  urination  '[]'$ Frequent urination   '[]'$ Blood in urine Skin:  '[]'$ Rashes   '[]'$ Ulcers  Psychological:  '[]'$ History of anxiety   '[]'$  History of major depression.  Physical Examination  There were no vitals filed for this visit. There is no height or weight on file to calculate BMI. Gen: WD/WN, NAD Head: [place]/AT, No temporalis wasting.  Ear/Nose/Throat: Hearing grossly intact, nares w/o erythema or drainage Eyes: PER, EOMI, sclera nonicteric.  Neck: Supple, no masses.  No bruit or JVD.  Pulmonary:  Good air movement, no audible wheezing, no use of accessory muscles.  Cardiac: RRR, normal S1, S2, no  Murmurs. Vascular:   No carotid bruits Vessel Right Left  Radial Palpable Palpable  Carotid Palpable Palpable  PT Palpable Palpable  Gastrointestinal: soft, non-distended. No guarding/no peritoneal signs.  Musculoskeletal: M/S 5/5 throughout.  No visible deformity.  Neurologic: CN 2-12 intact. Pain and light touch intact in extremities.  Symmetrical.  Speech is fluent. Motor exam as listed above. Psychiatric: Judgment intact, Mood & affect appropriate for pt's clinical situation. Dermatologic: No rashes or ulcers noted.  No changes consistent with cellulitis.   CBC Lab Results  Component Value Date   WBC 5.5 03/03/2020   HGB 13.7 03/03/2020   HCT 41.6 03/03/2020   MCV 92.7 03/03/2020   PLT 232 03/03/2020    BMET    Component Value Date/Time   NA 137 06/05/2020 1802   NA 136 08/16/2013 0759   K 3.5 06/05/2020 1802   K 3.2 (L) 08/16/2013 0759   CL 100 06/05/2020 1802   CL 104 08/16/2013 0759   CO2 22 06/05/2020 1802   CO2 19 (L) 08/16/2013 0759   GLUCOSE 91 06/05/2020 1802   GLUCOSE 97 03/03/2020 1430   GLUCOSE 83 08/16/2013 0759   BUN 24 06/05/2020 1802   BUN 17 08/16/2013 0759   CREATININE 1.62 (H) 06/05/2020 1802   CREATININE 1.18 08/16/2013 0759   CALCIUM 9.2 06/05/2020 1802   CALCIUM 8.7 08/16/2013 0759   GFRNONAA 39 (L) 04/24/2020 1857   GFRNONAA >60 03/03/2020 1430   GFRNONAA >60 08/16/2013 0759   GFRAA 45 (L) 04/24/2020 1857   GFRAA >60 08/16/2013 0759   CrCl cannot be calculated (Patient's most recent lab result is older than the maximum 21 days allowed.).  COAG No results found for: INR, PROTIME  Radiology No results found.   Assessment/Plan 1. Vertebral artery disease (HCC) Recommend:  Given the patient's asymptomatic subcritical stenosis no further invasive testing or surgery at this time.  Duplex ultrasound of the carotid and vertebral arteries shows 123456 and LICA [id number], there is antegrade flow in the left vertebral artery and known  occlusion of the right vertebral artery is again demonstrated.  Continue antiplatelet therapy as prescribed Continue management of CAD, HTN and Hyperlipidemia Healthy heart diet,  encouraged exercise at least 4 times per week Follow up in 12 months with duplex ultrasound and physical exam.    - VAS US CAROTID; Future  2. Benign essential hypertension Continue antihypertensive medications as already ordered, these medications have been reviewed and there are no changes at this time.   3. Dyslipidemia Continue statin as ordered and reviewed, no changes at this time     Hortencia Pilar, MD  11/09/2020 1:45 PM

## 2020-11-10 ENCOUNTER — Encounter (INDEPENDENT_AMBULATORY_CARE_PROVIDER_SITE_OTHER): Payer: Self-pay | Admitting: Vascular Surgery

## 2020-11-10 ENCOUNTER — Other Ambulatory Visit: Payer: Self-pay

## 2020-11-10 ENCOUNTER — Ambulatory Visit (INDEPENDENT_AMBULATORY_CARE_PROVIDER_SITE_OTHER): Payer: Self-pay | Admitting: Vascular Surgery

## 2020-11-10 ENCOUNTER — Ambulatory Visit (INDEPENDENT_AMBULATORY_CARE_PROVIDER_SITE_OTHER): Payer: Self-pay

## 2020-11-10 VITALS — BP 184/99 | HR 61 | Resp 16 | Wt 170.6 lb

## 2020-11-10 DIAGNOSIS — I779 Disorder of arteries and arterioles, unspecified: Secondary | ICD-10-CM

## 2020-11-10 DIAGNOSIS — E785 Hyperlipidemia, unspecified: Secondary | ICD-10-CM

## 2020-11-10 DIAGNOSIS — I10 Essential (primary) hypertension: Secondary | ICD-10-CM

## 2020-12-25 ENCOUNTER — Ambulatory Visit: Payer: Self-pay

## 2021-01-01 ENCOUNTER — Ambulatory Visit: Payer: Self-pay | Admitting: Gerontology

## 2021-01-01 ENCOUNTER — Other Ambulatory Visit: Payer: Self-pay

## 2021-01-01 VITALS — BP 195/100 | HR 90 | Temp 98.1°F | Ht 65.0 in | Wt 172.0 lb

## 2021-01-01 DIAGNOSIS — I10 Essential (primary) hypertension: Secondary | ICD-10-CM

## 2021-01-01 DIAGNOSIS — E785 Hyperlipidemia, unspecified: Secondary | ICD-10-CM

## 2021-01-01 MED ORDER — AMLODIPINE BESYLATE 10 MG PO TABS: 10.0000 mg | ORAL_TABLET | Freq: Every day | ORAL | 1 refills | Status: DC

## 2021-01-01 MED ORDER — HYDRALAZINE HCL 50 MG PO TABS: 50.0000 mg | ORAL_TABLET | Freq: Three times a day (TID) | ORAL | 1 refills | Status: DC

## 2021-01-01 MED ORDER — LOSARTAN POTASSIUM 50 MG PO TABS: 50.0000 mg | ORAL_TABLET | Freq: Every day | ORAL | 1 refills | Status: DC

## 2021-01-01 MED ORDER — ATORVASTATIN CALCIUM 40 MG PO TABS: 40.0000 mg | ORAL_TABLET | Freq: Every day | ORAL | 0 refills | Status: DC

## 2021-01-01 NOTE — Patient Instructions (Signed)

## 2021-01-01 NOTE — Progress Notes (Signed)
Established Patient Office Visit  Subjective:  Patient ID: Paul Howard, male    DOB: 1960-12-31  Age: 60 y.o. MRN: [id number]  CC:  Chief Complaint  Patient presents with   Medication Refill    Losartan, Amlodipine, Hydralazine Ran out for Amlodipine and Hydralazine x 1 mo for financial reasons. Losartan ran out yesterday.    HPI Paul Howard  is a 60 y/o male with PMH of Stage 3 CKD, Asthma, HTN, presents for follow up of hypertension and medication refill. His blood pressure  was elevated and he states that he ran out of his medication one month ago. He doesn't check his blood pressure at home, denies chest pain, palpitation, headache, dizziness, and vision changes. He was seen by Vascular Surgery Dr Vilinda Boehringer on 11/10/20 for evaluation of vertebral stenosis.  It was recommended to continue antiplatelet therapy, CAD,HTN and Hyperlipidemia management and follow up in 12 months. Overall, he states that he's doing well and offers no further complaint.  Past Medical History:  Diagnosis Date   Acute epigastric pain 10/22/2014   AKI (acute kidney injury) ([place]) 10/22/2014   Arthritis    Asthma    HTN (hypertension) 10/22/2014   Hypertension    Pancreatitis    Pancreatitis, alcoholic, acute 09/27/2954    Past Surgical History:  Procedure Laterality Date   TONSILLECTOMY      Family History  Problem Relation Age of Onset   Hypertension Mother    Throat cancer Father     Social History   Socioeconomic History   Marital status: Single    Spouse name: Not on file   Number of children: Not on file   Years of education: 11   Highest education level: 11th grade  Occupational History   Occupation: Contractor  Tobacco Use   Smoking status: Never   Smokeless tobacco: Never  Vaping Use   Vaping Use: Never used  Substance and Sexual Activity   Alcohol use: Yes    Alcohol/week: 7.0 standard drinks    Types: 7 Cans of beer per week    Comment: 1 beer/day   Drug use: Not  Currently    Types: Marijuana    Comment: 2-3x/week   Sexual activity: Not on file  Other Topics Concern   Not on file  Social History Narrative   Not on food stamps, hasn't applied. No transportation. Walks everywhere. Close to work, but far from paying rent and grocery.   Social Determinants of Health   Financial Resource Strain: Not on file  Food Insecurity: Not on file  Transportation Needs: Not on file  Physical Activity: Inactive   Days of Exercise per Week: 0 days   Minutes of Exercise per Session: 0 min  Stress: No Stress Concern Present   Feeling of Stress : Not at all  Social Connections: Not on file  Intimate Partner Violence: Not At Risk   Fear of Current or Ex-Partner: No   Emotionally Abused: No   Physically Abused: No   Sexually Abused: No    Outpatient Medications Prior to Visit  Medication Sig Dispense Refill   acetaminophen (TYLENOL) 650 MG CR tablet Take 650 mg by mouth every 8 (eight) hours as needed for pain (arthritis).     aspirin EC 81 MG tablet Take 1 tablet (81 mg total) by mouth daily. Swallow whole. 150 tablet 1   Blood Pressure KIT 1 kit by Does not apply route daily. 1 kit 0   amLODipine (NORVASC) 10  MG tablet Take 1 tablet (10 mg total) by mouth daily. 30 tablet 2   atorvastatin (LIPITOR) 40 MG tablet Take 1 tablet (40 mg total) by mouth daily. (Patient not taking: Reported on 01/01/2021) 30 tablet 0   hydrALAZINE (APRESOLINE) 50 MG tablet Take 1 tablet (50 mg total) by mouth 3 (three) times daily. 90 tablet 2   losartan (COZAAR) 50 MG tablet Take 1 tablet (50 mg total) by mouth daily. 90 tablet 1   No facility-administered medications prior to visit.    No Known Allergies  ROS Review of Systems  Constitutional: Negative.   Eyes: Negative.   Respiratory: Negative.    Cardiovascular: Negative.   Neurological: Negative.      Objective:    Physical Exam HENT:     Head: Normocephalic and atraumatic.     Mouth/Throat:     Mouth:  Mucous membranes are moist.  Eyes:     Extraocular Movements: Extraocular movements intact.     Conjunctiva/sclera: Conjunctivae normal.     Pupils: Pupils are equal, round, and reactive to light.  Cardiovascular:     Rate and Rhythm: Normal rate and regular rhythm.     Pulses: Normal pulses.     Heart sounds: Normal heart sounds.  Pulmonary:     Effort: Pulmonary effort is normal.     Breath sounds: Normal breath sounds.  Neurological:     General: No focal deficit present.     Mental Status: He is alert and oriented to person, place, and time. Mental status is at baseline.  Psychiatric:        Mood and Affect: Mood normal.        Behavior: Behavior normal.        Thought Content: Thought content normal.        Judgment: Judgment normal.    BP (!) 195/100 (BP Location: Left Arm, Cuff Size: Normal)   Pulse 90   Temp 98.1 F (36.7 C)   Ht '5\' 5"'  (1.651 m)   Wt 172 lb (78 kg)   SpO2 96%   BMI 28.62 kg/m  Wt Readings from Last 3 Encounters:  01/01/21 172 lb (78 kg)  11/10/20 170 lb 9.6 oz (77.4 kg)  09/25/20 170 lb (77.1 kg)     Health Maintenance Due  Topic Date Due   COVID-19 Vaccine (1) Never done   HIV Screening  Never done   TETANUS/TDAP  Never done   Zoster Vaccines- Shingrix (1 of 2) Never done   INFLUENZA VACCINE  Never done    There are no preventive care reminders to display for this patient.  Lab Results  Component Value Date   TSH 4.940 (H) 04/24/2020   Lab Results  Component Value Date   WBC 5.5 03/03/2020   HGB 13.7 03/03/2020   HCT 41.6 03/03/2020   MCV 92.7 03/03/2020   PLT 232 03/03/2020   Lab Results  Component Value Date   NA 137 06/05/2020   K 3.5 06/05/2020   CO2 22 06/05/2020   GLUCOSE 91 06/05/2020   BUN 24 06/05/2020   CREATININE 1.62 (H) 06/05/2020   BILITOT 0.3 06/05/2020   ALKPHOS 70 06/05/2020   AST 20 06/05/2020   ALT 27 06/05/2020   PROT 7.3 06/05/2020   ALBUMIN 4.3 06/05/2020   CALCIUM 9.2 06/05/2020   ANIONGAP 10  03/03/2020   EGFR 49 (L) 06/05/2020   Lab Results  Component Value Date   CHOL 181 04/24/2020   Lab Results  Component  Value Date   HDL 52 04/24/2020   Lab Results  Component Value Date   LDLCALC 105 (H) 04/24/2020   Lab Results  Component Value Date   TRIG 136 04/24/2020   Lab Results  Component Value Date   CHOLHDL 3.5 04/24/2020   Lab Results  Component Value Date   HGBA1C 5.7 (H) 04/24/2020      Assessment & Plan:   1. Essential hypertension - His blood pressure is not controlled, he was encouraged to pick up medication tomorrow, check blood pressure daily, record and notify clinic next week with readings. He was encouraged to continue on DASH diet. He was educated on signs and symptoms of Stroke, and advised to go to the ED. - amLODipine (NORVASC) 10 MG tablet; Take 1 tablet (10 mg total) by mouth daily.  Dispense: 90 tablet; Refill: 1 - hydrALAZINE (APRESOLINE) 50 MG tablet; Take 1 tablet (50 mg total) by mouth 3 (three) times daily.  Dispense: 270 tablet; Refill: 1 - losartan (COZAAR) 50 MG tablet; Take 1 tablet (50 mg total) by mouth daily.  Dispense: 90 tablet; Refill: 1 - Comp Met (CMET); Future - Comp Met (CMET)  2. Dyslipidemia - The 10-year ASCVD risk score (Arnett DK, et al., 2019) is: 25.7%   Values used to calculate the score:     Age: 86 years     Sex: Male     Is Non-Hispanic African American: Yes     Diabetic: No     Tobacco smoker: No     Systolic Blood Pressure: 237 mmHg     Is BP treated: Yes     HDL Cholesterol: 52 mg/dL     Total Cholesterol: 181 mg/dL His ASCVD risk was 25.7%, he will continue on current treatment regimen, low fat/ cholesterol diet . - atorvastatin (LIPITOR) 40 MG tablet; Take 1 tablet (40 mg total) by mouth daily.  Dispense: 90 tablet; Refill: 0     Follow-up: Return in about 4 weeks (around 01/29/2021), or if symptoms worsen or fail to improve.    [place]au [place] Jerold Coombe, NP

## 2021-01-02 LAB — COMPREHENSIVE METABOLIC PANEL
ALT: 16 IU/L (ref 0–44)
AST: 19 IU/L (ref 0–40)
Albumin/Globulin Ratio: 1.7 (ref 1.2–2.2)
Albumin: 4.5 g/dL (ref 3.8–4.9)
Alkaline Phosphatase: 66 IU/L (ref 44–121)
BUN/Creatinine Ratio: 15 (ref 9–20)
BUN: 23 mg/dL (ref 6–24)
Bilirubin Total: 0.4 mg/dL (ref 0.0–1.2)
CO2: 22 mmol/L (ref 20–29)
Calcium: 9.1 mg/dL (ref 8.7–10.2)
Chloride: 100 mmol/L (ref 96–106)
Creatinine, Ser: 1.5 mg/dL — ABNORMAL HIGH (ref 0.76–1.27)
Globulin, Total: 2.6 g/dL (ref 1.5–4.5)
Glucose: 94 mg/dL (ref 70–99)
Potassium: 4 mmol/L (ref 3.5–5.2)
Sodium: 137 mmol/L (ref 134–144)
Total Protein: 7.1 g/dL (ref 6.0–8.5)
eGFR: 53 mL/min/{1.73_m2} — ABNORMAL LOW (ref >59)

## 2021-01-29 ENCOUNTER — Ambulatory Visit: Payer: Self-pay | Admitting: Gerontology

## 2021-01-29 ENCOUNTER — Other Ambulatory Visit: Payer: Self-pay

## 2021-01-29 VITALS — BP 157/86 | HR 97 | Temp 98.8°F | Resp 16 | Wt 171.8 lb

## 2021-01-29 DIAGNOSIS — Z0000 Encounter for general adult medical examination without abnormal findings: Secondary | ICD-10-CM

## 2021-01-29 DIAGNOSIS — R7989 Other specified abnormal findings of blood chemistry: Secondary | ICD-10-CM | POA: Insufficient documentation

## 2021-01-29 DIAGNOSIS — I10 Essential (primary) hypertension: Secondary | ICD-10-CM

## 2021-01-29 NOTE — Patient Instructions (Signed)

## 2021-01-29 NOTE — Progress Notes (Signed)
Established Patient Office Visit  Subjective:  Patient ID: Paul Howard, male    DOB: 02-07-1961  Age: 60 y.o. MRN: [id number]  CC: No chief complaint on file.   HPI Paul Howard is a 60 y/o male with PMH of Stage 3 CKD, Asthma, HTN, presents for follow up of hypertensionpresents for follow up of hypertension. He states that he's compliant with his medications, denies side effects and continues to make healthy dietary changes. He does not check his blood pressure at home, because his bp machine is not working. He denies headache, dizziness, chest pain and vision changes. He self discontinued his Atorvastatin . His Serum creatinine done on 01/01/21 was 1.50 mg/dl. Overall, he states that he's doing well and offers no further complaint.  Past Medical History:  Diagnosis Date   Acute epigastric pain 10/22/2014   AKI (acute kidney injury) ([place]) 10/22/2014   Arthritis    Asthma    HTN (hypertension) 10/22/2014   Hypertension    Pancreatitis    Pancreatitis, alcoholic, acute 10/27/4164    Past Surgical History:  Procedure Laterality Date   TONSILLECTOMY      Family History  Problem Relation Age of Onset   Hypertension Mother    Throat cancer Father     Social History   Socioeconomic History   Marital status: Single    Spouse name: Not on file   Number of children: Not on file   Years of education: 11   Highest education level: 11th grade  Occupational History   Occupation: Contractor  Tobacco Use   Smoking status: Never   Smokeless tobacco: Never  Vaping Use   Vaping Use: Never used  Substance and Sexual Activity   Alcohol use: Yes    Alcohol/week: 7.0 standard drinks    Types: 7 Cans of beer per week    Comment: 1 beer/day   Drug use: Not Currently    Types: Marijuana    Comment: 2-3x/week   Sexual activity: Not on file  Other Topics Concern   Not on file  Social History Narrative   Not on food stamps, hasn't applied. No transportation. Walks everywhere.  Close to work, but far from paying rent and grocery.   Social Determinants of Health   Financial Resource Strain: Not on file  Food Insecurity: Not on file  Transportation Needs: Not on file  Physical Activity: Inactive   Days of Exercise per Week: 0 days   Minutes of Exercise per Session: 0 min  Stress: No Stress Concern Present   Feeling of Stress : Not at all  Social Connections: Not on file  Intimate Partner Violence: Not At Risk   Fear of Current or Ex-Partner: No   Emotionally Abused: No   Physically Abused: No   Sexually Abused: No    Outpatient Medications Prior to Visit  Medication Sig Dispense Refill   acetaminophen (TYLENOL) 650 MG CR tablet Take 650 mg by mouth every 8 (eight) hours as needed for pain (arthritis).     amLODipine (NORVASC) 10 MG tablet Take 1 tablet (10 mg total) by mouth daily. 90 tablet 1   Blood Pressure KIT 1 kit by Does not apply route daily. 1 kit 0   hydrALAZINE (APRESOLINE) 50 MG tablet Take 1 tablet (50 mg total) by mouth 3 (three) times daily. 270 tablet 1   losartan (COZAAR) 50 MG tablet Take 1 tablet (50 mg total) by mouth daily. 90 tablet 1   aspirin EC 81 MG  tablet Take 1 tablet (81 mg total) by mouth daily. Swallow whole. 150 tablet 1   atorvastatin (LIPITOR) 40 MG tablet Take 1 tablet (40 mg total) by mouth daily. (Patient not taking: Reported on 01/29/2021) 90 tablet 0   No facility-administered medications prior to visit.    No Known Allergies  ROS Review of Systems  Constitutional: Negative.   Eyes: Negative.   Respiratory: Negative.    Cardiovascular: Negative.   Endocrine: Negative.   Neurological: Negative.      Objective:    Physical Exam HENT:     Head: Normocephalic and atraumatic.  Eyes:     Extraocular Movements: Extraocular movements intact.     Conjunctiva/sclera: Conjunctivae normal.     Pupils: Pupils are equal, round, and reactive to light.  Cardiovascular:     Rate and Rhythm: Normal rate and regular  rhythm.     Pulses: Normal pulses.     Heart sounds: Normal heart sounds.  Pulmonary:     Effort: Pulmonary effort is normal.     Breath sounds: Normal breath sounds.  Neurological:     General: No focal deficit present.     Mental Status: He is alert and oriented to person, place, and time. Mental status is at baseline.  Psychiatric:        Mood and Affect: Mood normal.        Behavior: Behavior normal.        Thought Content: Thought content normal.        Judgment: Judgment normal.    BP (!) 157/86 (BP Location: Right Arm, Patient Position: Sitting, Cuff Size: Large)   Pulse 97   Temp 98.8 F (37.1 C)   Resp 16   Wt 171 lb 12.8 oz (77.9 kg)   BMI 28.59 kg/m  Wt Readings from Last 3 Encounters:  01/29/21 171 lb 12.8 oz (77.9 kg)  01/01/21 172 lb (78 kg)  11/10/20 170 lb 9.6 oz (77.4 kg)     Health Maintenance Due  Topic Date Due   COVID-19 Vaccine (1) Never done   HIV Screening  Never done   TETANUS/TDAP  Never done   Zoster Vaccines- Shingrix (1 of 2) Never done   INFLUENZA VACCINE  Never done    There are no preventive care reminders to display for this patient.  Lab Results  Component Value Date   TSH 4.940 (H) 04/24/2020   Lab Results  Component Value Date   WBC 5.5 03/03/2020   HGB 13.7 03/03/2020   HCT 41.6 03/03/2020   MCV 92.7 03/03/2020   PLT 232 03/03/2020   Lab Results  Component Value Date   NA 137 01/01/2021   K 4.0 01/01/2021   CO2 22 01/01/2021   GLUCOSE 94 01/01/2021   BUN 23 01/01/2021   CREATININE 1.50 (H) 01/01/2021   BILITOT 0.4 01/01/2021   ALKPHOS 66 01/01/2021   AST 19 01/01/2021   ALT 16 01/01/2021   PROT 7.1 01/01/2021   ALBUMIN 4.5 01/01/2021   CALCIUM 9.1 01/01/2021   ANIONGAP 10 03/03/2020   EGFR 53 (L) 01/01/2021   Lab Results  Component Value Date   CHOL 181 04/24/2020   Lab Results  Component Value Date   HDL 52 04/24/2020   Lab Results  Component Value Date   LDLCALC 105 (H) 04/24/2020   Lab Results   Component Value Date   TRIG 136 04/24/2020   Lab Results  Component Value Date   CHOLHDL 3.5 04/24/2020   Lab  Results  Component Value Date   HGBA1C 5.7 (H) 04/24/2020      Assessment & Plan:    1. Primary hypertension - His blood pressure is improving , but not at goal of less than 140/90. He will continue on current medication, DASH diet and exercise as tolerated.  2. Elevated serum creatinine - He was encouraged to increase water intake, low DASH diet and will recheck - Basic Metabolic Panel (BMET); Future  3. Healthcare maintenance - Routine labs will be checked. - Lipid panel; Future - HgB A1c; Future    Follow-up: Return in about 4 weeks (around 02/26/2021), or if symptoms worsen or fail to improve.    [place] Jerold Coombe, NP

## 2021-02-19 ENCOUNTER — Other Ambulatory Visit: Payer: Self-pay

## 2021-02-26 ENCOUNTER — Other Ambulatory Visit: Payer: Self-pay

## 2021-02-26 ENCOUNTER — Ambulatory Visit: Payer: Self-pay

## 2021-03-05 ENCOUNTER — Other Ambulatory Visit: Payer: Self-pay

## 2021-03-05 ENCOUNTER — Ambulatory Visit: Payer: Self-pay

## 2021-03-05 DIAGNOSIS — R7989 Other specified abnormal findings of blood chemistry: Secondary | ICD-10-CM

## 2021-03-05 DIAGNOSIS — Z0000 Encounter for general adult medical examination without abnormal findings: Secondary | ICD-10-CM

## 2021-03-06 LAB — BASIC METABOLIC PANEL WITH GFR
BUN/Creatinine Ratio: 16 (ref 9–20)
BUN: 25 mg/dL — ABNORMAL HIGH (ref 6–24)
CO2: 22 mmol/L (ref 20–29)
Calcium: 9.6 mg/dL (ref 8.7–10.2)
Chloride: 98 mmol/L (ref 96–106)
Creatinine, Ser: 1.52 mg/dL — ABNORMAL HIGH (ref 0.76–1.27)
Glucose: 91 mg/dL (ref 70–99)
Potassium: 3.9 mmol/L (ref 3.5–5.2)
Sodium: 136 mmol/L (ref 134–144)
eGFR: 52 mL/min/1.73 — ABNORMAL LOW

## 2021-03-06 LAB — LIPID PANEL
Chol/HDL Ratio: 3.5 ratio (ref 0.0–5.0)
Cholesterol, Total: 197 mg/dL (ref 100–199)
HDL: 56 mg/dL
LDL Chol Calc (NIH): 113 mg/dL — ABNORMAL HIGH (ref 0–99)
Triglycerides: 160 mg/dL — ABNORMAL HIGH (ref 0–149)
VLDL Cholesterol Cal: 28 mg/dL (ref 5–40)

## 2021-03-06 LAB — HEMOGLOBIN A1C
Est. average glucose Bld gHb Est-mCnc: 111 mg/dL
Hgb A1c MFr Bld: 5.5 % (ref 4.8–5.6)

## 2021-03-26 ENCOUNTER — Ambulatory Visit: Payer: Self-pay

## 2021-04-16 ENCOUNTER — Ambulatory Visit: Payer: Self-pay

## 2021-04-22 ENCOUNTER — Other Ambulatory Visit: Payer: Self-pay

## 2021-04-22 ENCOUNTER — Ambulatory Visit: Payer: Self-pay | Admitting: Family Medicine

## 2021-04-22 ENCOUNTER — Encounter: Payer: Self-pay | Admitting: Gerontology

## 2021-04-22 VITALS — BP 186/102 | HR 76 | Temp 97.5°F | Ht 65.0 in | Wt 174.5 lb

## 2021-04-22 DIAGNOSIS — N1831 Chronic kidney disease, stage 3a: Secondary | ICD-10-CM

## 2021-04-22 DIAGNOSIS — I10 Essential (primary) hypertension: Secondary | ICD-10-CM

## 2021-04-22 DIAGNOSIS — E785 Hyperlipidemia, unspecified: Secondary | ICD-10-CM

## 2021-04-22 DIAGNOSIS — I6509 Occlusion and stenosis of unspecified vertebral artery: Secondary | ICD-10-CM

## 2021-04-22 MED ORDER — LOSARTAN POTASSIUM 50 MG PO TABS: 50.0000 mg | ORAL_TABLET | Freq: Every day | ORAL | 3 refills | Status: DC

## 2021-04-22 MED ORDER — AMLODIPINE BESYLATE 10 MG PO TABS: 10.0000 mg | ORAL_TABLET | Freq: Every day | ORAL | 3 refills | Status: DC

## 2021-04-22 MED ORDER — DAPAGLIFLOZIN PROPANEDIOL 10 MG PO TABS: 10.0000 mg | ORAL_TABLET | Freq: Every day | ORAL | 3 refills | Status: DC

## 2021-04-22 MED ORDER — ATORVASTATIN CALCIUM 40 MG PO TABS: 40.0000 mg | ORAL_TABLET | Freq: Every day | ORAL | 3 refills | Status: DC

## 2021-04-22 NOTE — Patient Instructions (Signed)
1. Essential hypertension If you ever run out of medication please let us know.  Lets get you started back on your meds and see if we can't get this under better control.  Please continue to eat a low salt diet and try to get some activity outside of work like going for a walk.  - losartan (COZAAR) 50 MG tablet; Take 1 tablet (50 mg total) by mouth daily.  Dispense: 90 tablet; Refill: 3 - amLODipine (NORVASC) 10 MG tablet; Take 1 tablet (10 mg total) by mouth daily.  Dispense: 90 tablet; Refill: 3  2. Chronic kidney disease, stage 3a ([place]) Your kidney function is only slightly reduced and I would like to keep it that way.  Lets start a medication to help maintain your kidney function.  - dapagliflozin propanediol (FARXIGA) 10 MG TABS tablet; Take 1 tablet (10 mg total) by mouth daily before breakfast.  Dispense: 90 tablet; Refill: 3  3. Dyslipidemia Based on your vertebral stenosis and normal liver labs last time they were checked I do think it is worth while taking the atorvastatin.  Please get started on this.  - atorvastatin (LIPITOR) 40 MG tablet; Take 1 tablet (40 mg total) by mouth daily.  Dispense: 90 tablet; Refill: 3  4. Vertebral artery stenosis, unspecified laterality Please continue to follow with vascular surgery about every year.  Continue your aspirin daily and also start on the atorvastatin.   Follow up in about a month to check in on you blood pressure and also repeat labs.

## 2021-04-22 NOTE — Progress Notes (Signed)
Established Patient Office Visit  Subjective:  Patient ID: Paul Howard, male    DOB: Aug 17, 1960  Age: 61 y.o. MRN: [id number]  CC:  Chief Complaint  Patient presents with   Follow-up    BP monitor is broken. Cannot check at home. Did not take BP meds until 11:20 today.    HPI Paul Howard is a 61 y/o male with PMH of Stage 3 CKD, Asthma, HTN, presents for follow up of hypertension.  Pt last seen in the clinic on 01/29/21 for HTN follow up.  At that time BP noted to be 157/86.  Labs were also ordered at that visit and done in December showing normal a1c no longer in the prediabetes range, mildly elevated cholesterol with LDL to 113.  Kidney function continued to be reduced with GFR of 52 otherwise no electrolyte disarray.   HTN: Pt on losartan 50 mg daily, amlodipine 10 mg daily, and also hydralazine 50 mg TID.  Pt reports he has run out of the amlodipine and losartan for about 2 weeks.  Pt reports his BP machine at home broke about 8 months ago.  Pt trying to eat a low salt diet.  Gets lot of exercise at work.  HLD: Pt reports he has not been taking atorvastatin.  States someone told him to hold off due to kidney disease. Liver numbers normal back in October.  Willing to get on the atorvastatin.   CKD: Pt on losartan.  Pt thinks this started when placed on naproxen in the past.    Vertebral stenosis: Follows with vascular surgery.  Pt taking 81 mg ASA daily.     Past Medical History:  Diagnosis Date   Acute epigastric pain 10/22/2014   AKI (acute kidney injury) ([place]) 10/22/2014   Arthritis    Asthma    HTN (hypertension) 10/22/2014   Hypertension    Pancreatitis    Pancreatitis, alcoholic, acute 4/0/9811    Past Surgical History:  Procedure Laterality Date   TONSILLECTOMY      Family History  Problem Relation Age of Onset   Hypertension Mother    Throat cancer Father     Social History   Socioeconomic History   Marital status: Single    Spouse name: Not on file    Number of children: Not on file   Years of education: 11   Highest education level: 11th grade  Occupational History   Occupation: Contractor  Tobacco Use   Smoking status: Never   Smokeless tobacco: Never  Vaping Use   Vaping Use: Never used  Substance and Sexual Activity   Alcohol use: Yes    Alcohol/week: 7.0 standard drinks    Types: 7 Cans of beer per week    Comment: 1 beer/day   Drug use: Not Currently    Types: Marijuana    Comment: 2-3x/week   Sexual activity: Not on file  Other Topics Concern   Not on file  Social History Narrative   Not on food stamps, hasn't applied. No transportation. Walks everywhere. Close to work, but far from paying rent and grocery.   Social Determinants of Health   Financial Resource Strain: Not on file  Food Insecurity: No Food Insecurity   Worried About Charity fundraiser in the Last Year: Never true   Ran Out of Food in the Last Year: Never true  Transportation Needs: Unmet Transportation Needs   Lack of Transportation (Medical): Yes   Lack of Transportation (Non-Medical): Yes  Physical  Activity: Inactive   Days of Exercise per Week: 0 days   Minutes of Exercise per Session: 0 min  Stress: No Stress Concern Present   Feeling of Stress : Not at all  Social Connections: Not on file  Intimate Partner Violence: Not At Risk   Fear of Current or Ex-Partner: No   Emotionally Abused: No   Physically Abused: No   Sexually Abused: No    Outpatient Medications Prior to Visit  Medication Sig Dispense Refill   acetaminophen (TYLENOL) 650 MG CR tablet Take 650 mg by mouth every 8 (eight) hours as needed for pain (arthritis).     losartan (COZAAR) 50 MG tablet Take 1 tablet (50 mg total) by mouth daily. 90 tablet 1   amLODipine (NORVASC) 10 MG tablet Take 1 tablet (10 mg total) by mouth daily. 90 tablet 1   atorvastatin (LIPITOR) 40 MG tablet Take 1 tablet (40 mg total) by mouth daily. (Patient not taking: Reported on 01/29/2021) 90  tablet 0   Blood Pressure KIT 1 kit by Does not apply route daily. 1 kit 0   hydrALAZINE (APRESOLINE) 50 MG tablet Take 1 tablet (50 mg total) by mouth 3 (three) times daily. 270 tablet 1   No facility-administered medications prior to visit.    No Known Allergies  ROS Review of Systems    Objective:    Physical Exam Constitutional:      Appearance: Normal appearance.  HENT:     Head: Normocephalic and atraumatic.  Cardiovascular:     Rate and Rhythm: Normal rate and regular rhythm.  Pulmonary:     Effort: Pulmonary effort is normal.     Breath sounds: Normal breath sounds.  Abdominal:     General: Abdomen is flat.     Tenderness: There is no abdominal tenderness.  Skin:    General: Skin is warm and dry.  Neurological:     Mental Status: He is alert.    BP (!) 186/102 (BP Location: Left Arm, Patient Position: Sitting, Cuff Size: Normal)    Pulse 76    Temp (!) 97.5 F (36.4 C)    Ht '5\' 5"'  (1.651 m)    Wt 174 lb 8 oz (79.2 kg)    SpO2 95%    BMI 29.04 kg/m  Wt Readings from Last 3 Encounters:  04/22/21 174 lb 8 oz (79.2 kg)  01/29/21 171 lb 12.8 oz (77.9 kg)  01/01/21 172 lb (78 kg)     Health Maintenance Due  Topic Date Due   COVID-19 Vaccine (1) Never done   HIV Screening  Never done   TETANUS/TDAP  Never done   Zoster Vaccines- Shingrix (1 of 2) Never done   INFLUENZA VACCINE  Never done    There are no preventive care reminders to display for this patient.  Lab Results  Component Value Date   TSH 4.940 (H) 04/24/2020   Lab Results  Component Value Date   WBC 5.5 03/03/2020   HGB 13.7 03/03/2020   HCT 41.6 03/03/2020   MCV 92.7 03/03/2020   PLT 232 03/03/2020   Lab Results  Component Value Date   NA 136 03/05/2021   K 3.9 03/05/2021   CO2 22 03/05/2021   GLUCOSE 91 03/05/2021   BUN 25 (H) 03/05/2021   CREATININE 1.52 (H) 03/05/2021   BILITOT 0.4 01/01/2021   ALKPHOS 66 01/01/2021   AST 19 01/01/2021   ALT 16 01/01/2021   PROT 7.1  01/01/2021   ALBUMIN 4.5 01/01/2021  CALCIUM 9.6 03/05/2021   ANIONGAP 10 03/03/2020   EGFR 52 (L) 03/05/2021   Lab Results  Component Value Date   CHOL 197 03/05/2021   Lab Results  Component Value Date   HDL 56 03/05/2021   Lab Results  Component Value Date   LDLCALC 113 (H) 03/05/2021   Lab Results  Component Value Date   TRIG 160 (H) 03/05/2021   Lab Results  Component Value Date   CHOLHDL 3.5 03/05/2021   Lab Results  Component Value Date   HGBA1C 5.5 03/05/2021      Assessment & Plan:    1. Essential hypertension If you ever run out of medication please let us know.  Lets get you started back on your meds and see if we can't get this under better control.  Please continue to eat a low salt diet and try to get some activity outside of work like going for a walk.  - losartan (COZAAR) 50 MG tablet; Take 1 tablet (50 mg total) by mouth daily.  Dispense: 90 tablet; Refill: 3 - amLODipine (NORVASC) 10 MG tablet; Take 1 tablet (10 mg total) by mouth daily.  Dispense: 90 tablet; Refill: 3  2. Chronic kidney disease, stage 3a ([place]) Your kidney function is only slightly reduced and I would like to keep it that way.  Lets start a medication to help maintain your kidney function.  - dapagliflozin propanediol (FARXIGA) 10 MG TABS tablet; Take 1 tablet (10 mg total) by mouth daily before breakfast.  Dispense: 90 tablet; Refill: 3  3. Dyslipidemia Based on your vertebral stenosis and normal liver labs last time they were checked I do think it is worth while taking the atorvastatin.  Please get started on this.  - atorvastatin (LIPITOR) 40 MG tablet; Take 1 tablet (40 mg total) by mouth daily.  Dispense: 90 tablet; Refill: 3  4. Vertebral artery stenosis, unspecified laterality Please continue to follow with vascular surgery about every year.  Continue your aspirin daily and also start on the atorvastatin.   Follow up in about a month to check in on you blood pressure and  also repeat labs including TSH and T4 due to past elevation.   Collene Leyden, MD

## 2021-04-29 ENCOUNTER — Ambulatory Visit: Payer: Self-pay | Admitting: Gerontology

## 2021-05-21 ENCOUNTER — Other Ambulatory Visit: Payer: Self-pay

## 2021-05-28 ENCOUNTER — Ambulatory Visit: Payer: Self-pay

## 2021-05-28 ENCOUNTER — Other Ambulatory Visit: Payer: Self-pay

## 2021-06-04 ENCOUNTER — Ambulatory Visit: Payer: Self-pay

## 2021-06-04 ENCOUNTER — Other Ambulatory Visit: Payer: Self-pay

## 2021-06-11 ENCOUNTER — Ambulatory Visit: Payer: Self-pay

## 2021-06-18 ENCOUNTER — Ambulatory Visit: Payer: Self-pay | Admitting: Gerontology

## 2021-06-18 ENCOUNTER — Encounter: Payer: Self-pay | Admitting: Gerontology

## 2021-06-18 VITALS — BP 178/103 | HR 90 | Temp 98.0°F | Resp 16 | Ht 65.0 in | Wt 167.1 lb

## 2021-06-18 DIAGNOSIS — I10 Essential (primary) hypertension: Secondary | ICD-10-CM

## 2021-06-18 MED ORDER — LOSARTAN POTASSIUM 50 MG PO TABS: 50.0000 mg | ORAL_TABLET | Freq: Every day | ORAL | 3 refills | Status: DC

## 2021-06-18 MED ORDER — HYDRALAZINE HCL 50 MG PO TABS
50.0000 mg | ORAL_TABLET | Freq: Three times a day (TID) | ORAL | 3 refills | Status: DC
Start: 2021-06-18 — End: 2021-11-16

## 2021-06-18 MED ORDER — AMLODIPINE BESYLATE 10 MG PO TABS: 10.0000 mg | ORAL_TABLET | Freq: Every day | ORAL | 3 refills | Status: DC

## 2021-06-18 NOTE — Patient Instructions (Signed)

## 2021-06-18 NOTE — Progress Notes (Signed)
? ?Established Patient Office Visit ? ?Subjective:  ?Patient ID: Paul Howard, male    DOB: [birth year]  Age: 61 y.o. MRN: [id number] ? ?CC:  ?Chief Complaint  ?Patient presents with  ? Follow-up  ? Hypertension  ?  Patient has been out of HTN med ~1 month  ? Hyperlipidemia  ?  Patient states he can't afford the medication  ? ? ?HPI ?Paul Howard is a 61 y/o male with PMH of Stage 3 CKD, Asthma, HTN, presents for follow of up of hypertension. He states that he's out of Amlodipine, Hydralazine and Losartan for 1 month, states that he can't afford Atorvastatin and Farxiga. He states that he checks his blood pressure at home and it's elevated, continues to make healthy lifestyle changes. He denies chest pain, palpitation, headache, light headedness and vision changes. Overall, he states that he's doing well and offers no further complaint.  ? ?Past Medical History:  ?Diagnosis Date  ? Acute epigastric pain 10/22/2014  ? AKI (acute kidney injury) ([place]) 10/22/2014  ? Arthritis   ? Asthma   ? HTN (hypertension) 10/22/2014  ? Hypertension   ? Pancreatitis   ? Pancreatitis, alcoholic, acute 04/23/2977  ? ? ?Past Surgical History:  ?Procedure Laterality Date  ? TONSILLECTOMY    ? ? ?Family History  ?Problem Relation Age of Onset  ? Hypertension Mother   ? Throat cancer Father   ? Hypertension Sister   ? Other Maternal Grandmother   ?     unknown medical history  ? Other Maternal Grandfather   ?     unknown medical history  ? Other Paternal Grandmother   ?     unknown medical history  ? Other Paternal Grandfather   ?     unknown medical history  ? ? ?Social History  ? ?Socioeconomic History  ? Marital status: Single  ?  Spouse name: Not on file  ? Number of children: Not on file  ? Years of education: 30  ? Highest education level: 11th grade  ?Occupational History  ? Occupation: Contractor  ?Tobacco Use  ? Smoking status: Never  ? Smokeless tobacco: Never  ?Vaping Use  ? Vaping Use: Never used  ?Substance and Sexual Activity   ? Alcohol use: Yes  ?  Alcohol/week: 7.0 standard drinks  ?  Types: 7 Cans of beer per week  ?  Comment: 1 beer/day  ? Drug use: Not Currently  ?  Types: Marijuana  ?  Comment: 2-3x/week, last use 10/2021  ? Sexual activity: Not on file  ?Other Topics Concern  ? Not on file  ?Social History Narrative  ? Not on food stamps, hasn't applied. No transportation. Walks everywhere. Close to work, but far from paying rent and grocery.  ? ?Social Determinants of Health  ? ?Financial Resource Strain: Not on file  ?Food Insecurity: No Food Insecurity  ? Worried About Charity fundraiser in the Last Year: Never true  ? Ran Out of Food in the Last Year: Never true  ?Transportation Needs: Unmet Transportation Needs  ? Lack of Transportation (Medical): Yes  ? Lack of Transportation (Non-Medical): Yes  ?Physical Activity: Inactive  ? Days of Exercise per Week: 0 days  ? Minutes of Exercise per Session: 0 min  ?Stress: No Stress Concern Present  ? Feeling of Stress : Not at all  ?Social Connections: Not on file  ?Intimate Partner Violence: Not At Risk  ? Fear of Current or Ex-Partner: No  ?  Emotionally Abused: No  ? Physically Abused: No  ? Sexually Abused: No  ? ? ?Outpatient Medications Prior to Visit  ?Medication Sig Dispense Refill  ? acetaminophen (TYLENOL) 650 MG CR tablet Take 650 mg by mouth every 8 (eight) hours as needed for pain (arthritis).    ? Blood Pressure KIT 1 kit by Does not apply route daily. 1 kit 0  ? atorvastatin (LIPITOR) 40 MG tablet Take 1 tablet (40 mg total) by mouth daily. 90 tablet 3  ? dapagliflozin propanediol (FARXIGA) 10 MG TABS tablet Take 1 tablet (10 mg total) by mouth daily before breakfast. (Patient not taking: Reported on 06/18/2021) 90 tablet 3  ? amLODipine (NORVASC) 10 MG tablet Take 1 tablet (10 mg total) by mouth daily. (Patient not taking: Reported on 06/18/2021) 90 tablet 3  ? hydrALAZINE (APRESOLINE) 50 MG tablet Take 1 tablet (50 mg total) by mouth 3 (three) times daily. 270 tablet 1  ?  losartan (COZAAR) 50 MG tablet Take 1 tablet (50 mg total) by mouth daily. (Patient not taking: Reported on 06/18/2021) 90 tablet 3  ? ?No facility-administered medications prior to visit.  ? ? ?No Known Allergies ? ?ROS ?Review of Systems  ?Constitutional: Negative.   ?Eyes: Negative.   ?Respiratory: Negative.    ?Cardiovascular: Negative.   ?Neurological: Negative.   ? ?  ?Objective:  ?  ?Physical Exam ?HENT:  ?   Head: Normocephalic and atraumatic.  ?Eyes:  ?   Extraocular Movements: Extraocular movements intact.  ?   Conjunctiva/sclera: Conjunctivae normal.  ?   Pupils: Pupils are equal, round, and reactive to light.  ?Cardiovascular:  ?   Rate and Rhythm: Normal rate and regular rhythm.  ?   Pulses: Normal pulses.  ?   Heart sounds: Normal heart sounds.  ?Pulmonary:  ?   Effort: Pulmonary effort is normal.  ?   Breath sounds: Normal breath sounds.  ?Neurological:  ?   General: No focal deficit present.  ?   Mental Status: He is alert and oriented to person, place, and time. Mental status is at baseline.  ?Psychiatric:     ?   Mood and Affect: Mood normal.     ?   Behavior: Behavior normal.     ?   Thought Content: Thought content normal.     ?   Judgment: Judgment normal.  ? ? ?BP (!) 178/103 (BP Location: Left Arm, Patient Position: Sitting, Cuff Size: Large)   Pulse 90   Temp 98 ?F (36.7 ?C) (Oral)   Resp 16   Ht '5\' 5"'  (1.651 m)   Wt 167 lb 1.6 oz (75.8 kg)   SpO2 94%   BMI 27.81 kg/m?  ?Wt Readings from Last 3 Encounters:  ?06/18/21 167 lb 1.6 oz (75.8 kg)  ?04/22/21 174 lb 8 oz (79.2 kg)  ?01/29/21 171 lb 12.8 oz (77.9 kg)  ? ? ? ?Health Maintenance Due  ?Topic Date Due  ? COVID-19 Vaccine (1) Never done  ? HIV Screening  Never done  ? TETANUS/TDAP  Never done  ? Zoster Vaccines- Shingrix (1 of 2) Never done  ? INFLUENZA VACCINE  Never done  ? ? ?There are no preventive care reminders to display for this patient. ? ?Lab Results  ?Component Value Date  ? TSH 4.940 (H) 04/24/2020  ? ?Lab Results   ?Component Value Date  ? WBC 5.5 03/03/2020  ? HGB 13.7 03/03/2020  ? HCT 41.6 03/03/2020  ? MCV 92.7 03/03/2020  ? PLT  232 03/03/2020  ? ?Lab Results  ?Component Value Date  ? NA 136 03/05/2021  ? K 3.9 03/05/2021  ? CO2 22 03/05/2021  ? GLUCOSE 91 03/05/2021  ? BUN 25 (H) 03/05/2021  ? CREATININE 1.52 (H) 03/05/2021  ? BILITOT 0.4 01/01/2021  ? ALKPHOS 66 01/01/2021  ? AST 19 01/01/2021  ? ALT 16 01/01/2021  ? PROT 7.1 01/01/2021  ? ALBUMIN 4.5 01/01/2021  ? CALCIUM 9.6 03/05/2021  ? ANIONGAP 10 03/03/2020  ? EGFR 52 (L) 03/05/2021  ? ?Lab Results  ?Component Value Date  ? CHOL 197 03/05/2021  ? ?Lab Results  ?Component Value Date  ? HDL 56 03/05/2021  ? ?Lab Results  ?Component Value Date  ? LDLCALC 113 (H) 03/05/2021  ? ?Lab Results  ?Component Value Date  ? TRIG 160 (H) 03/05/2021  ? ?Lab Results  ?Component Value Date  ? CHOLHDL 3.5 03/05/2021  ? ?Lab Results  ?Component Value Date  ? HGBA1C 5.5 03/05/2021  ? ? ?  ?Assessment & Plan:  ? ? ?1. Essential hypertension ?- His blood pressure is not controlled, his goal should be less than 130/80. He was encouraged to pick up medication, DASH diet and exercise as tolerated. He was educated on signs and symptoms of stroke and advised to go to the ED, he verbalized understanding. He was encouraged to complete his paper work at [employer] so to pick up Lipitor and [place]. ?- losartan (COZAAR) 50 MG tablet; Take 1 tablet (50 mg total) by mouth daily.  Dispense: 30 tablet; Refill: 3 ?- amLODipine (NORVASC) 10 MG tablet; Take 1 tablet (10 mg total) by mouth daily.  Dispense: 30 tablet; Refill: 3 ?- hydrALAZINE (APRESOLINE) 50 MG tablet; Take 1 tablet (50 mg total) by mouth 3 (three) times daily.  Dispense: 90 tablet; Refill: 3 ? ? ? ?Follow-up: Return in about 4 weeks (around 07/16/2021), or if symptoms worsen or fail to improve.  ? ? ?Arla Boutwell Jerold Coombe, NP ?

## 2021-07-23 ENCOUNTER — Ambulatory Visit: Payer: Self-pay

## 2021-07-30 ENCOUNTER — Other Ambulatory Visit: Payer: Self-pay

## 2021-07-30 ENCOUNTER — Encounter: Payer: Self-pay | Admitting: Gerontology

## 2021-07-30 ENCOUNTER — Ambulatory Visit: Payer: Self-pay | Admitting: Gerontology

## 2021-07-30 VITALS — BP 152/96 | HR 87 | Temp 98.3°F | Resp 16 | Ht 65.0 in | Wt 164.7 lb

## 2021-07-30 DIAGNOSIS — E785 Hyperlipidemia, unspecified: Secondary | ICD-10-CM

## 2021-07-30 DIAGNOSIS — I10 Essential (primary) hypertension: Secondary | ICD-10-CM

## 2021-07-30 DIAGNOSIS — R7989 Other specified abnormal findings of blood chemistry: Secondary | ICD-10-CM

## 2021-07-30 NOTE — Patient Instructions (Signed)

## 2021-07-30 NOTE — Progress Notes (Signed)
? ?Established Patient Office Visit ? ?Subjective   ?Patient ID: Paul Howard, male    DOB: May 16, 1960  Age: 61 y.o. MRN: [id number] ? ?Chief Complaint  ?Patient presents with  ? Follow-up  ? ? ?HPI ? ?Paul Howard is a 61 y/o male with PMH of Stage 3 CKD, Asthma, HTN, presents for follow of up of hypertension. He states that he's compliant with his medications, denies side effects and continues to make healthy lifestyle changes. His blood pressure was elevated during visit at 164/95. His Serum creatinine done on 03/02/21 was 1.52 mg/dl and eGFR was 52, LDL was 113 mg/dl and Triglycerides 160 mg/dl.  Overall, he states that he is doing well and offers no further complaint. ? ?Review of Systems  ?Constitutional: Negative.   ?Respiratory: Negative.    ?Cardiovascular: Negative.   ?Neurological: Negative.   ?Psychiatric/Behavioral: Negative.    ? ?  ?Objective:  ?  ? ?BP (!) 152/96 (BP Location: Right Arm, Patient Position: Sitting, Cuff Size: Large)   Pulse 87   Temp 98.3 ?F (36.8 ?C) (Oral)   Resp 16   Ht 5' 5" (1.651 m)   Wt 164 lb 11.2 oz (74.7 kg)   SpO2 96%   BMI 27.41 kg/m?  ?BP Readings from Last 3 Encounters:  ?07/30/21 (!) 152/96  ?06/18/21 (!) 178/103  ?04/22/21 (!) 186/102  ? ?Wt Readings from Last 3 Encounters:  ?07/30/21 164 lb 11.2 oz (74.7 kg)  ?06/18/21 167 lb 1.6 oz (75.8 kg)  ?04/22/21 174 lb 8 oz (79.2 kg)  ? ?  ? ?Physical Exam ?HENT:  ?   Head: Normocephalic and atraumatic.  ?Eyes:  ?   Extraocular Movements: Extraocular movements intact.  ?   Conjunctiva/sclera: Conjunctivae normal.  ?   Pupils: Pupils are equal, round, and reactive to light.  ?Cardiovascular:  ?   Rate and Rhythm: Normal rate and regular rhythm.  ?   Pulses: Normal pulses.  ?   Heart sounds: Normal heart sounds.  ?Pulmonary:  ?   Effort: Pulmonary effort is normal.  ?   Breath sounds: Normal breath sounds.  ?Skin: ?   General: Skin is warm.  ?Neurological:  ?   General: No focal deficit present.  ?   Mental Status:  He is alert and oriented to person, place, and time. Mental status is at baseline.  ?Psychiatric:     ?   Mood and Affect: Mood normal.     ?   Behavior: Behavior normal.     ?   Thought Content: Thought content normal.     ?   Judgment: Judgment normal.  ? ? ? ?No results found for any visits on 07/30/21. ? ?Last CBC ?Lab Results  ?Component Value Date  ? WBC 5.5 03/03/2020  ? HGB 13.7 03/03/2020  ? HCT 41.6 03/03/2020  ? MCV 92.7 03/03/2020  ? MCH 30.5 03/03/2020  ? RDW 12.8 03/03/2020  ? PLT 232 03/03/2020  ? ?Last metabolic panel ?Lab Results  ?Component Value Date  ? GLUCOSE 91 03/05/2021  ? NA 136 03/05/2021  ? K 3.9 03/05/2021  ? CL 98 03/05/2021  ? CO2 22 03/05/2021  ? BUN 25 (H) 03/05/2021  ? CREATININE 1.52 (H) 03/05/2021  ? EGFR 52 (L) 03/05/2021  ? CALCIUM 9.6 03/05/2021  ? PHOS 4.4 (H) 12/28/2018  ? PROT 7.1 01/01/2021  ? ALBUMIN 4.5 01/01/2021  ? LABGLOB 2.6 01/01/2021  ? AGRATIO 1.7 01/01/2021  ? BILITOT 0.4 01/01/2021  ? ALKPHOS  66 01/01/2021  ? AST 19 01/01/2021  ? ALT 16 01/01/2021  ? ANIONGAP 10 03/03/2020  ? ?Last lipids ?Lab Results  ?Component Value Date  ? CHOL 197 03/05/2021  ? HDL 56 03/05/2021  ? LDLCALC 113 (H) 03/05/2021  ? TRIG 160 (H) 03/05/2021  ? CHOLHDL 3.5 03/05/2021  ? ?Last hemoglobin A1c ?Lab Results  ?Component Value Date  ? HGBA1C 5.5 03/05/2021  ? ?  ? ?The 10-year ASCVD risk score (Arnett DK, et al., 2019) is: 16.9% ? ?  ?Assessment & Plan:  ? ?1. Primary hypertension ?-His blood pressure is not under control, he will continue on current medication, DASH diet and exercise as tolerated.  He was encouraged to keep checking his blood pressure at home, record and bring log to follow up appointment. ? ?2. Elevated serum creatinine ?-He was encouraged to increase water intake and we will recheck his BMP. ?- Basic Metabolic Panel (BMET); Future ?- Basic Metabolic Panel (BMET) ? ?3. Dyslipidemia ?-He is ASCVD was 16.9%, he states that he cannot afford his Lipitor.  He was encouraged  to continue on low-fat/cholesterol diet and exercise as tolerated.  We will refer him to the pharmacy for medication assistance. ?- Lipid panel; Future ?- Lipid panel ? ? ? ?Return in about 6 weeks (around 09/10/2021), or if symptoms worsen or fail to improve.  ? ? ?Eiko Mcgowen Jerold Coombe, NP ? ?

## 2021-07-31 LAB — BASIC METABOLIC PANEL
BUN/Creatinine Ratio: 21 — ABNORMAL HIGH (ref 9–20)
BUN: 27 mg/dL — ABNORMAL HIGH (ref 6–24)
CO2: 22 mmol/L (ref 20–29)
Calcium: 8.9 mg/dL (ref 8.7–10.2)
Chloride: 101 mmol/L (ref 96–106)
Creatinine, Ser: 1.31 mg/dL — ABNORMAL HIGH (ref 0.76–1.27)
Glucose: 99 mg/dL (ref 70–99)
Potassium: 3.5 mmol/L (ref 3.5–5.2)
Sodium: 137 mmol/L (ref 134–144)
eGFR: 63 mL/min/{1.73_m2} (ref >59)

## 2021-07-31 LAB — LIPID PANEL
Chol/HDL Ratio: 3.6 ratio (ref 0.0–5.0)
Cholesterol, Total: 186 mg/dL (ref 100–199)
HDL: 52 mg/dL (ref >39)
LDL Chol Calc (NIH): 107 mg/dL — ABNORMAL HIGH (ref 0–99)
Triglycerides: 157 mg/dL — ABNORMAL HIGH (ref 0–149)
VLDL Cholesterol Cal: 27 mg/dL (ref 5–40)

## 2021-09-10 ENCOUNTER — Ambulatory Visit: Payer: Self-pay | Admitting: Gerontology

## 2021-10-01 IMAGING — MR MR HEAD W/O CM
10 series · 48 of 48 positions shown · IV contrast (gadavist)
Comparison: None.

CLINICAL DATA: Neck pain with right shoulder numbness and tingling.

EXAM:
MRI HEAD WITHOUT CONTRAST
MRA NECK WITHOUT AND WITH CONTRAST
TECHNIQUE: Multiplanar, multiecho pulse sequences of the brain and surrounding
structures were obtained without intravenous contrast. Angiographic
images of the neck were obtained using MRA technique with and
without intravenous contrast. Carotid stenosis measurements (when
applicable) are obtained utilizing NASCET criteria, using the distal
internal carotid diameter as the denominator.
CONTRAST:  8mL GADAVIST GADOBUTROL 1 MMOL/ML IV SOLN

[Series 14: ax dwi_tracew · axial · 3.0mm · 1.31mm/px · z∈[-97,+56]mm · 8 of 48 slices shown]
[im 1/48]
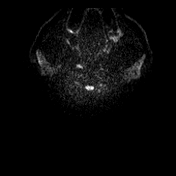
[im 7/48]
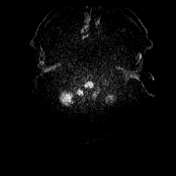
[im 14/48]
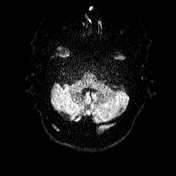
[im 21/48]
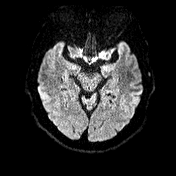
[im 27/48]
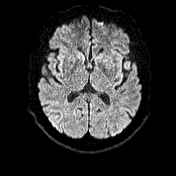
[im 34/48]
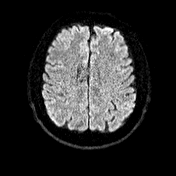
[im 41/48]
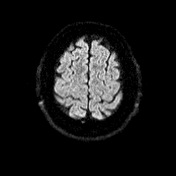
[im 48/48]
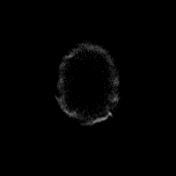

[Series 15: ax dwi_adc · axial · 3.0mm · 1.31mm/px · z∈[-97,+56]mm · 7 of 47 slices shown]
[im 1/47]
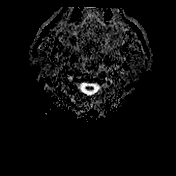
[im 8/47]
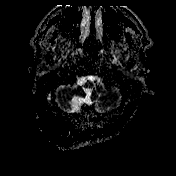
[im 16/47]
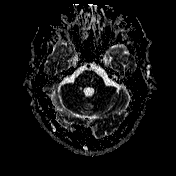
[im 24/47]
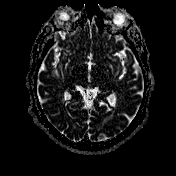
[im 31/47]
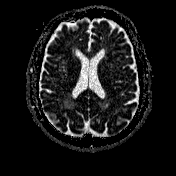
[im 39/47]
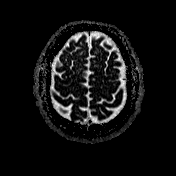
[im 47/47]
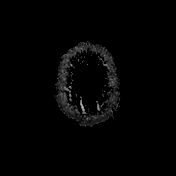

[Series 16: cor dwi_tracew · coronal · 5.0mm · 1.31mm/px · 5 of 38 slices shown]
[im 1/38]
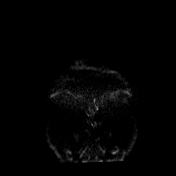
[im 10/38]
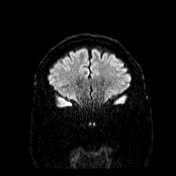
[im 19/38]
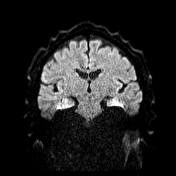
[im 28/38]
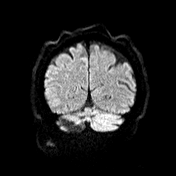
[im 38/38]
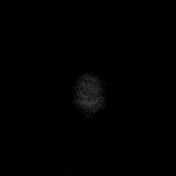

[Series 17: cor dwi_adc · coronal · 5.0mm · 1.31mm/px · 5 of 38 slices shown]
[im 1/38]
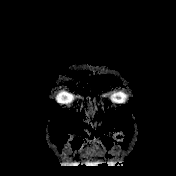
[im 10/38]
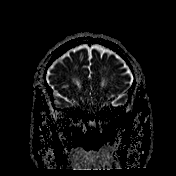
[im 19/38]
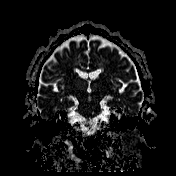
[im 28/38]
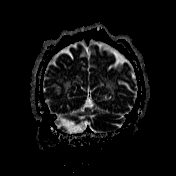
[im 38/38]
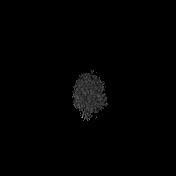

[Series 18: T1 · sagittal · 5.0mm · 0.94mm/px · 3 of 23 slices shown (1 of 2)]
[im 1/23]
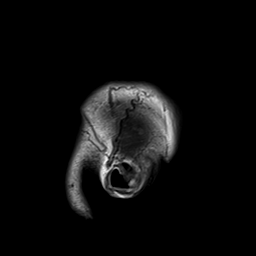
[im 12/23]
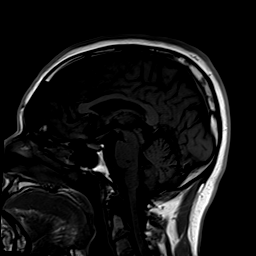
[im 23/23]
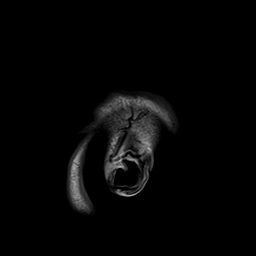

[Series 19: T2 · axial · 5.0mm · 0.45mm/px · z∈[-98,+57]mm · 4 of 27 slices shown (1 of 2)]
[im 1/27]
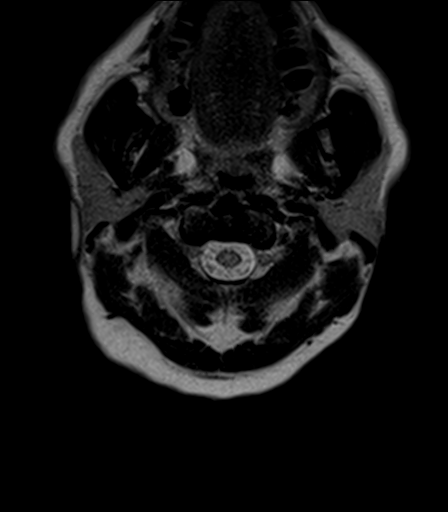
[im 9/27]
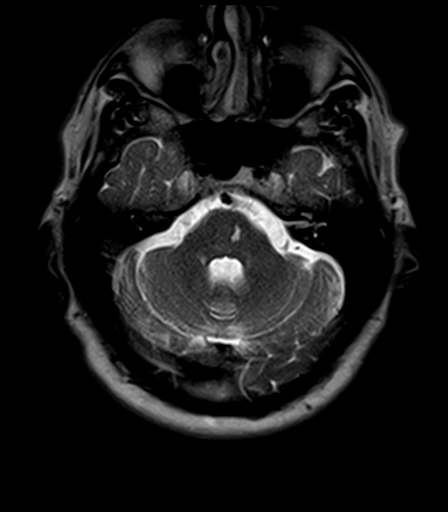
[im 18/27]
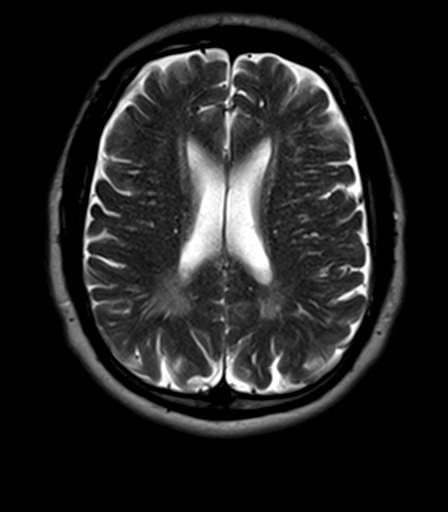
[im 27/27]
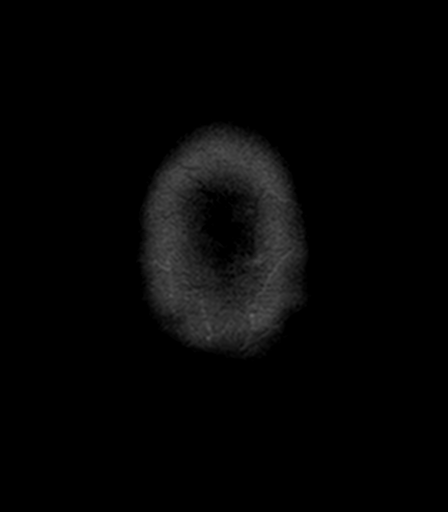

[Series 20: T2-star · axial · 5.0mm · 0.45mm/px · z∈[-98,+57]mm · 4 of 27 slices shown]
[im 1/27]
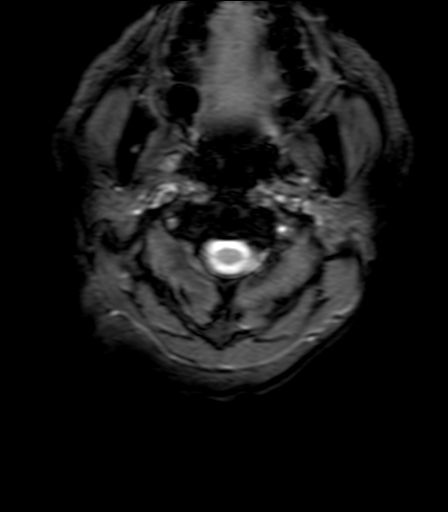
[im 9/27]
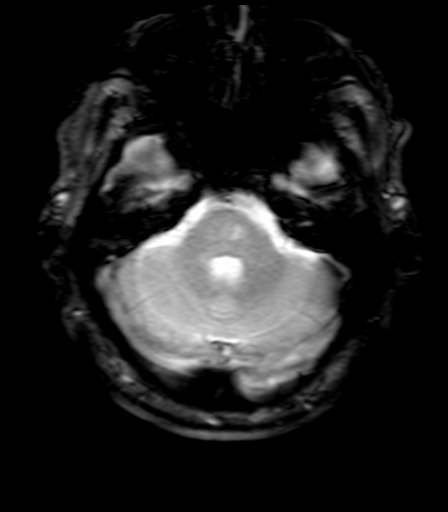
[im 18/27]
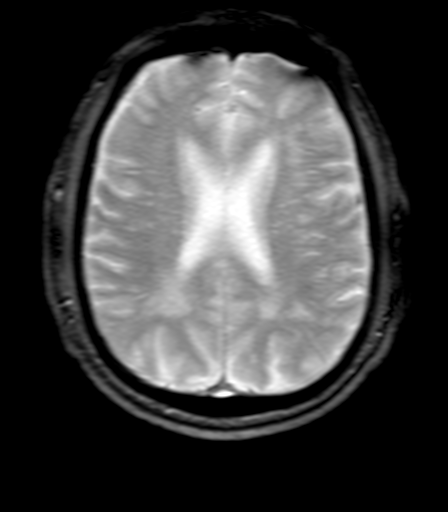
[im 27/27]
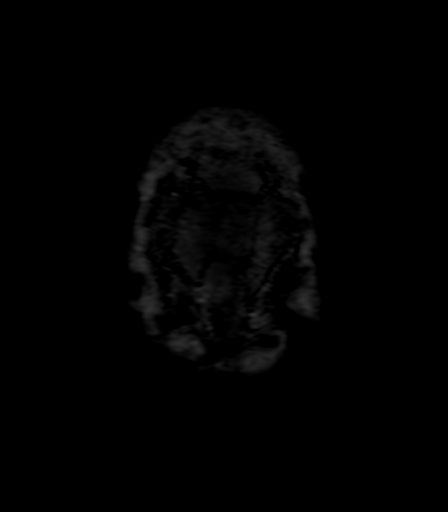

[Series 21: FLAIR · axial · 5.0mm · 1.20mm/px · z∈[-98,+57]mm · 4 of 27 slices shown]
[im 1/27]
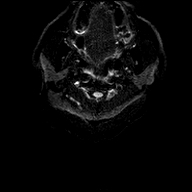
[im 9/27]
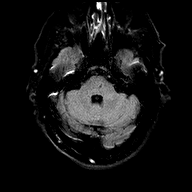
[im 18/27]
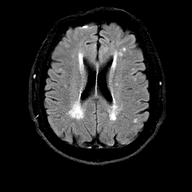
[im 27/27]
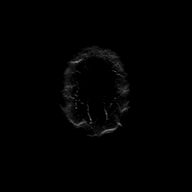

[Series 22: T1 · axial · 5.0mm · 0.90mm/px · z∈[-98,+57]mm · 4 of 27 slices shown (2 of 2)]
[im 1/27]
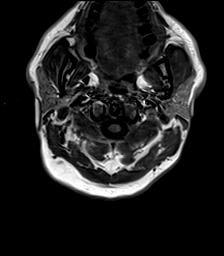
[im 9/27]
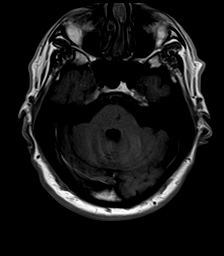
[im 18/27]
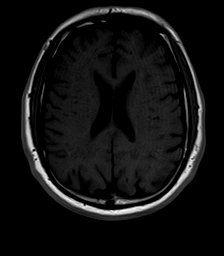
[im 27/27]
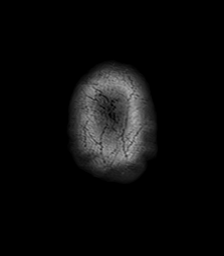

[Series 23: T2 · coronal · 5.0mm · 0.45mm/px · 4 of 31 slices shown (2 of 2)]
[im 1/31]
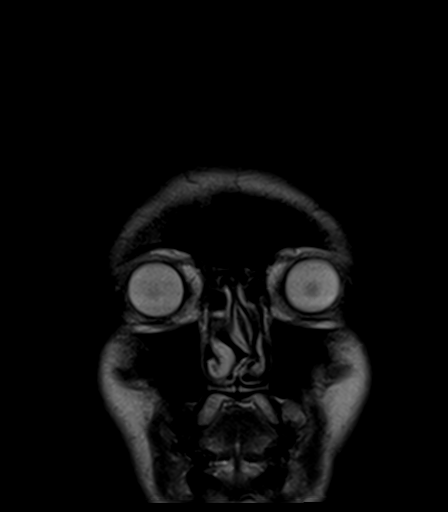
[im 11/31]
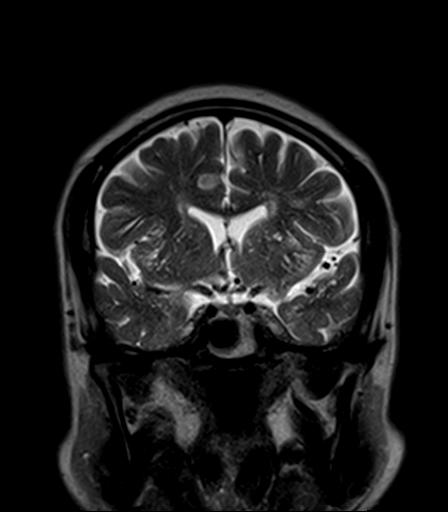
[im 21/31]
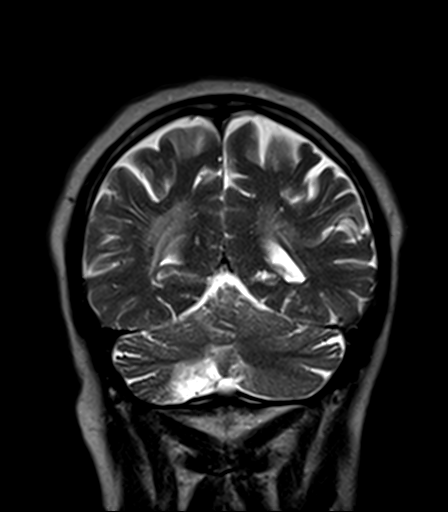
[im 31/31]
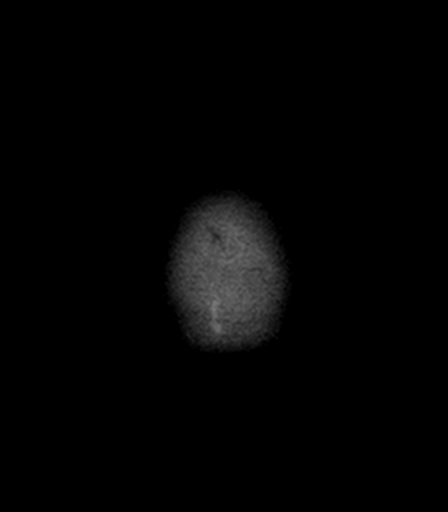

[48 of 48 positions shown; findings below may reference images not displayed]

FINDINGS: MRI HEAD FINDINGS

Brain: No acute infarct, mass effect or extra-axial collection. No
acute or chronic hemorrhage. There is multifocal hyperintense
T2-weighted signal within the white matter. Parenchymal volume and
CSF spaces are normal. There is an old right cerebellar infarct. The
midline structures are normal.

Vascular: Major flow voids are preserved.

Skull and upper cervical spine: Right occipital scalp lipoma. Normal
skull base.

Sinuses/Orbits:No paranasal sinus fluid levels or advanced mucosal
thickening. No mastoid or middle ear effusion. Normal orbits.

MRA NECK FINDINGS

The right vertebral artery is occluded from its origin to the V4
segment where it is reconstituted by collateral flow across the
vertebrobasilar confluence. The carotid systems and the left
vertebral artery are normal.
IMPRESSION: 1. Occlusion of the right vertebral artery from its origin to the V4
segment where it is reconstituted by collateral flow across the
vertebrobasilar confluence.
2. No acute intracranial abnormality.
3. Old right cerebellar infarct and findings of chronic small vessel
disease.

Critical Value/emergent results were called by telephone at the time
of interpretation on 03/03/2020 at [DATE] to Dr. Auntyjatty, Who
verbally acknowledged these results.

## 2021-10-08 ENCOUNTER — Other Ambulatory Visit: Payer: Self-pay

## 2021-10-08 ENCOUNTER — Ambulatory Visit: Payer: Self-pay | Admitting: Gerontology

## 2021-10-08 ENCOUNTER — Encounter: Payer: Self-pay | Admitting: Gerontology

## 2021-10-08 VITALS — BP 146/87 | HR 93 | Temp 98.4°F | Resp 16 | Ht 65.0 in | Wt 155.8 lb

## 2021-10-08 DIAGNOSIS — I10 Essential (primary) hypertension: Secondary | ICD-10-CM

## 2021-10-08 DIAGNOSIS — E785 Hyperlipidemia, unspecified: Secondary | ICD-10-CM

## 2021-10-08 MED ORDER — ATORVASTATIN CALCIUM 40 MG PO TABS
40.0000 mg | ORAL_TABLET | Freq: Every day | ORAL | 3 refills | Status: DC
  Filled 2021-10-08: qty 30, 30d supply, fill #0
  Filled 2021-11-13: qty 30, 30d supply, fill #1
  Filled 2021-12-14: qty 30, 30d supply, fill #2

## 2021-10-08 MED ORDER — AMLODIPINE BESYLATE 10 MG PO TABS
10.0000 mg | ORAL_TABLET | Freq: Every day | ORAL | 3 refills | Status: DC
  Filled 2021-10-08 (×2): qty 30, 30d supply, fill #0
  Filled 2021-11-13: qty 30, 30d supply, fill #1
  Filled 2021-12-14: qty 30, 30d supply, fill #2

## 2021-10-08 MED ORDER — LOSARTAN POTASSIUM 50 MG PO TABS
50.0000 mg | ORAL_TABLET | Freq: Every day | ORAL | 3 refills | Status: DC
  Filled 2021-10-08: qty 30, 30d supply, fill #0
  Filled 2021-11-13: qty 30, 30d supply, fill #1
  Filled 2021-11-13: qty 23, 23d supply, fill #1
  Filled 2021-12-14: qty 30, 30d supply, fill #2

## 2021-10-08 NOTE — Patient Instructions (Signed)

## 2021-10-08 NOTE — Progress Notes (Signed)
Established Patient Office Visit  Subjective   Patient ID: Paul Howard, male    DOB: 12/23/1960  Age: 61 y.o. MRN: [id number]  Chief Complaint  Patient presents with   Follow-up    Labs drawn 07/30/21   Hypertension    Patient has been out of all his medications since May 2023    HPI  Paul Howard is a 61 y/o male with PMH of Stage 3 CKD, Asthma, HTN, presents for follow of up of hypertension.  He states that he has not been taking his blood pressure medicine since his last visit on 07/30/2021 because he could not afford it.  He states that he checks his blood pressure once a week is being less than 150/90, but continues to work on his diet.  He denies chest pain, palpitation, lightheadedness and vision changes.  His labs done on 07/30/2021, his serum creatinine decreased from 1.52 to 1.31 mg per DL, his LDL decreased from _0 mg per DL to 107 mg per DL.  Overall, he states that he is doing well and offers no further complaint.  Review of Systems  Constitutional: Negative.   Eyes: Negative.   Respiratory: Negative.    Cardiovascular: Negative.   Neurological: Negative.   Psychiatric/Behavioral: Negative.        Objective:     BP (!) 146/87 (BP Location: Left Arm, Patient Position: Sitting, Cuff Size: Large)   Pulse 93   Temp 98.4 F (36.9 C) (Oral)   Resp 16   Ht _1  (1.651 m)   Wt 155 lb 12.8 oz (70.7 kg)   SpO2 95%   BMI 25.93 kg/m  BP Readings from Last 3 Encounters:  10/08/21 (!) 146/87  07/30/21 (!) 152/96  06/18/21 (!) 178/103   Wt Readings from Last 3 Encounters:  10/08/21 155 lb 12.8 oz (70.7 kg)  07/30/21 164 lb 11.2 oz (74.7 kg)  06/18/21 167 lb 1.6 oz (75.8 kg)      Physical Exam HENT:     Head: Normocephalic and atraumatic.     Mouth/Throat:     Mouth: Mucous membranes are moist.  Eyes:     Extraocular Movements: Extraocular movements intact.     Conjunctiva/sclera: Conjunctivae normal.     Pupils: Pupils are equal, round, and reactive  to light.  Cardiovascular:     Rate and Rhythm: Normal rate and regular rhythm.     Pulses: Normal pulses.     Heart sounds: Normal heart sounds.  Pulmonary:     Effort: Pulmonary effort is normal.     Breath sounds: Normal breath sounds.  Skin:    General: Skin is warm.  Neurological:     General: No focal deficit present.     Mental Status: He is alert and oriented to person, place, and time. Mental status is at baseline.  Psychiatric:        Mood and Affect: Mood normal.        Behavior: Behavior normal.        Thought Content: Thought content normal.        Judgment: Judgment normal.      No results found for any visits on 10/08/21.  Last CBC Lab Results  Component Value Date   WBC 5.5 03/03/2020   HGB 13.7 03/03/2020   HCT 41.6 03/03/2020   MCV 92.7 03/03/2020   MCH 30.5 03/03/2020   RDW 12.8 03/03/2020   PLT 232 33/00/7622   Last metabolic panel Lab Results  Component Value Date   GLUCOSE 99 07/30/2021   NA 137 07/30/2021   K 3.5 07/30/2021   CL 101 07/30/2021   CO2 22 07/30/2021   BUN 27 (H) 07/30/2021   CREATININE 1.31 (H) 07/30/2021   EGFR 63 07/30/2021   CALCIUM 8.9 07/30/2021   PHOS 4.4 (H) 12/28/2018   PROT 7.1 01/01/2021   ALBUMIN 4.5 01/01/2021   LABGLOB 2.6 01/01/2021   AGRATIO 1.7 01/01/2021   BILITOT 0.4 01/01/2021   ALKPHOS 66 01/01/2021   AST 19 01/01/2021   ALT 16 01/01/2021   ANIONGAP 10 03/03/2020   Last lipids Lab Results  Component Value Date   CHOL 186 07/30/2021   HDL 52 07/30/2021   LDLCALC 107 (H) 07/30/2021   TRIG 157 (H) 07/30/2021   CHOLHDL 3.6 07/30/2021   Last hemoglobin A1c Lab Results  Component Value Date   HGBA1C 5.5 03/05/2021      The 10-year ASCVD risk score (Arnett DK, et al., 2019) is: 15.8%    Assessment & Plan:    1. Essential hypertension -His blood pressure is not under control, his goal should be less than 140/90.  His amlodipine 10 mg, losartan 50 mg was sent to [place] and his  medication is free at no cost to him and he states that he will pick it up tomorrow.  He was encouraged to continue on DASH diet.  We will recheck his BMP and was encouraged to increase water intake.  He was educated on the signs and symptoms of stroke and advised to go to the emergency room. - amLODipine (NORVASC) 10 MG tablet; Take 1 tablet (10 mg total) by mouth daily.  Dispense: 30 tablet; Refill: 3 - losartan (COZAAR) 50 MG tablet; Take 1 tablet (50 mg total) by mouth daily.  Dispense: 30 tablet; Refill: 3 - Basic Metabolic Panel (BMET); Future - Basic Metabolic Panel (BMET)  2. Dyslipidemia -He will continue on current medication, low-fat cholesterol diet and exercise as tolerated. - atorvastatin (LIPITOR) 40 MG tablet; Take 1 tablet (40 mg total) by mouth daily.  Dispense: 30 tablet; Refill: 3   Return in about 1 month (around 11/10/2021), or if symptoms worsen or fail to improve.    Paul Luna Jerold Coombe, NP

## 2021-10-09 LAB — BASIC METABOLIC PANEL
BUN/Creatinine Ratio: 13 (ref 9–20)
BUN: 20 mg/dL (ref 6–24)
CO2: 27 mmol/L (ref 20–29)
Calcium: 9.1 mg/dL (ref 8.7–10.2)
Chloride: 97 mmol/L (ref 96–106)
Creatinine, Ser: 1.59 mg/dL — ABNORMAL HIGH (ref 0.76–1.27)
Glucose: 100 mg/dL — ABNORMAL HIGH (ref 70–99)
Potassium: 3.1 mmol/L — ABNORMAL LOW (ref 3.5–5.2)
Sodium: 137 mmol/L (ref 134–144)
eGFR: 50 mL/min/{1.73_m2} — ABNORMAL LOW (ref >59)

## 2021-10-16 ENCOUNTER — Other Ambulatory Visit: Payer: Self-pay | Admitting: Pharmacy Technician

## 2021-10-16 ENCOUNTER — Other Ambulatory Visit: Payer: Self-pay

## 2021-10-16 NOTE — Patient Outreach (Signed)
Patient only signed DOH Attestation.  Would need to provide current year's household income if PAP medications were needed.  Antanasia Kaczynski J. Clevie Prout Patient Advocate Specialist [facility]  

## 2021-11-12 ENCOUNTER — Other Ambulatory Visit (INDEPENDENT_AMBULATORY_CARE_PROVIDER_SITE_OTHER): Payer: Self-pay | Admitting: Vascular Surgery

## 2021-11-12 DIAGNOSIS — I6523 Occlusion and stenosis of bilateral carotid arteries: Secondary | ICD-10-CM

## 2021-11-13 ENCOUNTER — Other Ambulatory Visit: Payer: Self-pay

## 2021-11-16 ENCOUNTER — Encounter (INDEPENDENT_AMBULATORY_CARE_PROVIDER_SITE_OTHER): Payer: Self-pay | Admitting: Vascular Surgery

## 2021-11-16 ENCOUNTER — Ambulatory Visit (INDEPENDENT_AMBULATORY_CARE_PROVIDER_SITE_OTHER): Payer: Self-pay

## 2021-11-16 ENCOUNTER — Ambulatory Visit (INDEPENDENT_AMBULATORY_CARE_PROVIDER_SITE_OTHER): Payer: Self-pay | Admitting: Vascular Surgery

## 2021-11-16 VITALS — BP 177/104 | HR 60 | Resp 18 | Ht 65.0 in | Wt 153.6 lb

## 2021-11-16 DIAGNOSIS — I10 Essential (primary) hypertension: Secondary | ICD-10-CM

## 2021-11-16 DIAGNOSIS — I779 Disorder of arteries and arterioles, unspecified: Secondary | ICD-10-CM

## 2021-11-16 DIAGNOSIS — I6529 Occlusion and stenosis of unspecified carotid artery: Secondary | ICD-10-CM | POA: Insufficient documentation

## 2021-11-16 DIAGNOSIS — E785 Hyperlipidemia, unspecified: Secondary | ICD-10-CM

## 2021-11-16 DIAGNOSIS — I6523 Occlusion and stenosis of bilateral carotid arteries: Secondary | ICD-10-CM

## 2021-11-16 NOTE — Progress Notes (Signed)
MRN : [id number]  Paul Howard is a 61 y.o. (1960/12/15) male who presents with chief complaint of check carotid arteries.  History of Present Illness:   The patient is seen for evaluation of vertebral stenosis. The  vertebral artery stenosis was identified after he went to the emergency room with neck pain and numbness of his right arm and shoulder.  MRI was obtained which demonstrated occlusion of the right vertebral artery the left vertebral artery and the basilar artery are widely patent the right and left carotid arteries are widely patent with minimal disease.   The patient denies amaurosis fugax. There is no recent history of TIA symptoms or focal motor deficits. There is a prior documented CVA.   There is no history of migraine headaches. There is no history of seizures.   The patient is taking enteric-coated aspirin 81 mg daily.   The patient has a history of coronary artery disease, no recent episodes of angina or shortness of breath. The patient denies PAD or claudication symptoms. There is a history of hyperlipidemia which is being treated with a statin.    Duplex ultrasound of the carotid and vertebral arteries today shows WUJW<11% and LICA 91-47%, there is antegrade flow in the left vertebral artery and known occlusion of the right vertebral artery is again demonstrated.   Current Meds  Medication Sig   acetaminophen (TYLENOL) 650 MG CR tablet Take 650 mg by mouth every 8 (eight) hours as needed for pain (arthritis).   amLODipine (NORVASC) 10 MG tablet Take 1 tablet (10 mg total) by mouth daily.   atorvastatin (LIPITOR) 40 MG tablet Take 1 tablet (40 mg total) by mouth daily.   Blood Pressure KIT 1 kit by Does not apply route daily.   losartan (COZAAR) 50 MG tablet Take 1 tablet (50 mg total) by mouth daily.    Past Medical History:  Diagnosis Date   Acute epigastric pain 10/22/2014   AKI (acute kidney injury) ([place]) 10/22/2014   Arthritis    Asthma    HTN  (hypertension) 10/22/2014   Hypertension    Pancreatitis    Pancreatitis, alcoholic, acute 10/21/9560    Past Surgical History:  Procedure Laterality Date   TONSILLECTOMY      Social History Social History   Tobacco Use   Smoking status: Never   Smokeless tobacco: Never  Vaping Use   Vaping Use: Never used  Substance Use Topics   Alcohol use: Yes    Alcohol/week: 7.0 standard drinks of alcohol    Types: 7 Cans of beer per week    Comment: 1 beer/day   Drug use: Not Currently    Types: Marijuana    Comment: 2-3x/week, last use 10/2020    Family History Family History  Problem Relation Age of Onset   Hypertension Mother    Throat cancer Father    Hypertension Sister    Other Maternal Grandmother        unknown medical history   Other Maternal Grandfather        unknown medical history   Other Paternal Grandmother        unknown medical history   Other Paternal Grandfather        unknown medical history    No Known Allergies   REVIEW OF SYSTEMS (Negative unless checked)  Constitutional: '[]' Weight loss  '[]' Fever  '[]' Chills Cardiac: '[]' Chest pain   '[]' Chest pressure   '[]' Palpitations   '[]' Shortness of breath when laying flat   '[]'   Shortness of breath with exertion. Vascular:  '[x]' Pain in legs with walking   '[]' Pain in legs at rest  '[]' History of DVT   '[]' Phlebitis   '[]' Swelling in legs   '[]' Varicose veins   '[]' Non-healing ulcers Pulmonary:   '[]' Uses home oxygen   '[]' Productive cough   '[]' Hemoptysis   '[]' Wheeze  '[]' COPD   '[]' Asthma Neurologic:  '[]' Dizziness   '[]' Seizures   '[]' History of stroke   '[]' History of TIA  '[]' Aphasia   '[]' Vissual changes   '[]' Weakness or numbness in arm   '[]' Weakness or numbness in leg Musculoskeletal:   '[]' Joint swelling   '[]' Joint pain   '[]' Low back pain Hematologic:  '[]' Easy bruising  '[]' Easy bleeding   '[]' Hypercoagulable state   '[]' Anemic Gastrointestinal:  '[]' Diarrhea   '[]' Vomiting  '[]' Gastroesophageal reflux/heartburn   '[]' Difficulty swallowing. Genitourinary:  '[]' Chronic kidney  disease   '[]' Difficult urination  '[]' Frequent urination   '[]' Blood in urine Skin:  '[]' Rashes   '[]' Ulcers  Psychological:  '[]' History of anxiety   '[]'  History of major depression.  Physical Examination  Vitals:   11/16/21 1026  BP: (!) 177/104  Pulse: 60  Resp: 18  Weight: 153 lb 9.6 oz (69.7 kg)  Height: '5\' 5"'  (1.651 m)   Body mass index is 25.56 kg/m. Gen: WD/WN, NAD Head: [place]/AT, No temporalis wasting.  Ear/Nose/Throat: Hearing grossly intact, nares w/o erythema or drainage Eyes: PER, EOMI, sclera nonicteric.  Neck: Supple, no masses.  No bruit or JVD.  Pulmonary:  Good air movement, no audible wheezing, no use of accessory muscles.  Cardiac: RRR, normal S1, S2, no Murmurs. Vascular:  carotid bruit noted Vessel Right Left  Radial Palpable Palpable  Carotid  Palpable  Palpable  Subclav  Palpable Palpable  Gastrointestinal: soft, non-distended. No guarding/no peritoneal signs.  Musculoskeletal: M/S 5/5 throughout.  No visible deformity.  Neurologic: CN 2-12 intact. Pain and light touch intact in extremities.  Symmetrical.  Speech is fluent. Motor exam as listed above. Psychiatric: Judgment intact, Mood & affect appropriate for pt's clinical situation. Dermatologic: No rashes or ulcers noted.  No changes consistent with cellulitis.   CBC Lab Results  Component Value Date   WBC 5.5 03/03/2020   HGB 13.7 03/03/2020   HCT 41.6 03/03/2020   MCV 92.7 03/03/2020   PLT 232 03/03/2020    BMET    Component Value Date/Time   NA 137 10/08/2021 1505   NA 136 08/16/2013 0759   K 3.1 (L) 10/08/2021 1505   K 3.2 (L) 08/16/2013 0759   CL 97 10/08/2021 1505   CL 104 08/16/2013 0759   CO2 27 10/08/2021 1505   CO2 19 (L) 08/16/2013 0759   GLUCOSE 100 (H) 10/08/2021 1505   GLUCOSE 97 03/03/2020 1430   GLUCOSE 83 08/16/2013 0759   BUN 20 10/08/2021 1505   BUN 17 08/16/2013 0759   CREATININE 1.59 (H) 10/08/2021 1505   CREATININE 1.18 08/16/2013 0759   CALCIUM 9.1 10/08/2021 1505    CALCIUM 8.7 08/16/2013 0759   GFRNONAA 39 (L) 04/24/2020 1857   GFRNONAA >60 03/03/2020 1430   GFRNONAA >60 08/16/2013 0759   GFRAA 45 (L) 04/24/2020 1857   GFRAA >60 08/16/2013 0759   CrCl cannot be calculated (Patient's most recent lab result is older than the maximum 21 days allowed.).  COAG No results found for: "INR", "PROTIME"  Radiology No results found.   Assessment/Plan 1. Bilateral carotid artery stenosis Recommend:   Given the patient's asymptomatic subcritical stenosis no further invasive testing or surgery at this time.  Duplex ultrasound of the carotid and vertebral arteries shows JGOT<15% and LICA 72-62%, there is antegrade flow in the left vertebral artery and known occlusion of the right vertebral artery is again demonstrated.   Continue antiplatelet therapy as prescribed Continue management of CAD, HTN and Hyperlipidemia Healthy heart diet,  encouraged exercise at least 4 times per week Follow up in 12 months with duplex ultrasound and physical exam. - VAS US CAROTID; Future  2. Vertebral artery disease (HCC) Recommend:   Given the patient's asymptomatic subcritical stenosis no further invasive testing or surgery at this time.   Duplex ultrasound of the carotid and vertebral arteries shows MBTD<97% and LICA 41-63%, there is antegrade flow in the left vertebral artery and known occlusion of the right vertebral artery is again demonstrated.   Continue antiplatelet therapy as prescribed Continue management of CAD, HTN and Hyperlipidemia Healthy heart diet,  encouraged exercise at least 4 times per week Follow up in 12 months with duplex ultrasound and physical exam. - VAS US CAROTID; Future  3. Primary hypertension Continue antihypertensive medications as already ordered, these medications have been reviewed and there are no changes at this time.   4. Dyslipidemia Continue statin as ordered and reviewed, no changes at this time     Hortencia Pilar,  MD  11/16/2021 11:06 AM

## 2021-12-14 ENCOUNTER — Other Ambulatory Visit: Payer: Self-pay

## 2021-12-16 ENCOUNTER — Ambulatory Visit: Payer: Self-pay | Admitting: Gerontology

## 2021-12-24 ENCOUNTER — Ambulatory Visit: Payer: Self-pay | Admitting: Gerontology

## 2021-12-24 ENCOUNTER — Other Ambulatory Visit: Payer: Self-pay

## 2021-12-24 ENCOUNTER — Encounter: Payer: Self-pay | Admitting: Gerontology

## 2021-12-24 VITALS — BP 122/76 | HR 78 | Resp 16 | Wt 155.7 lb

## 2021-12-24 DIAGNOSIS — I10 Essential (primary) hypertension: Secondary | ICD-10-CM

## 2021-12-24 DIAGNOSIS — E785 Hyperlipidemia, unspecified: Secondary | ICD-10-CM

## 2021-12-24 DIAGNOSIS — H538 Other visual disturbances: Secondary | ICD-10-CM

## 2021-12-24 MED ORDER — LOSARTAN POTASSIUM 50 MG PO TABS
50.0000 mg | ORAL_TABLET | Freq: Every day | ORAL | 1 refills | Status: DC
  Filled 2021-12-24 – 2022-01-15 (×2): qty 90, 90d supply, fill #0
  Filled 2022-04-19: qty 90, 90d supply, fill #1

## 2021-12-24 MED ORDER — ATORVASTATIN CALCIUM 40 MG PO TABS
40.0000 mg | ORAL_TABLET | Freq: Every day | ORAL | 1 refills | Status: DC
  Filled 2021-12-24 – 2022-01-15 (×2): qty 90, 90d supply, fill #0
  Filled 2022-04-19: qty 90, 90d supply, fill #1

## 2021-12-24 MED ORDER — AMLODIPINE BESYLATE 10 MG PO TABS
10.0000 mg | ORAL_TABLET | Freq: Every day | ORAL | 1 refills | Status: DC
  Filled 2021-12-24 – 2022-01-15 (×2): qty 90, 90d supply, fill #0
  Filled 2022-04-19: qty 90, 90d supply, fill #1

## 2021-12-24 MED ORDER — ASPIRIN 81 MG PO TBEC
81.0000 mg | DELAYED_RELEASE_TABLET | Freq: Every day | ORAL | 12 refills | Status: DC
  Filled 2021-12-24: qty 30, 30d supply, fill #0
  Filled 2022-02-04 – 2022-04-19 (×2): qty 30, 30d supply, fill #1

## 2021-12-24 NOTE — Progress Notes (Signed)
Established Patient Office Visit  Subjective   Patient ID: Paul Howard, male    DOB: [birth year]  Age: 61 y.o. MRN: [id number]  HTN Follow-up   HPI  Paul Howard is a 61 y/o male with PMH of Stage 3 CKD, Asthma, HTN, presents for follow of up of hypertension. He states he checks his blood pressure once a week and it normally is 120s/70s mmHg. He endorses bilateral thigh and knee leg pain that is "achey" and a 9/10. He takes tylenol with minimal relief. The pain is worse when he is standing on concrete floors at work and is relieved by rest. His last BMP was done on 10/08/2021 and was notable for a Cr of 1.59 mg/dL and eGFR 50. He endorses increased urination at night, but he does drink beer before he goes to bed. He denies dysuria, bubbly urination, urgency, or dark urine. Overall, he is well and offers no other complaints.    Review of Systems  Constitutional: Negative.   HENT: Negative.    Eyes:  Positive for blurred vision (eye glasses broke).  Respiratory: Negative.    Cardiovascular: Negative.   Gastrointestinal: Negative.   Genitourinary:  Positive for frequency (intermittently at night).  Musculoskeletal:  Positive for myalgias (bilateral thigh and knee pain).  Neurological: Negative.   Endo/Heme/Allergies: Negative.   Psychiatric/Behavioral: Negative.        Objective:     BP 122/76 (BP Location: Right Arm, Patient Position: Sitting, Cuff Size: Large)   Pulse 78   Resp 16   Wt 155 lb 11.2 oz (70.6 kg)   BMI 25.91 kg/m  BP Readings from Last 3 Encounters:  12/24/21 122/76  11/16/21 (!) 177/104  10/08/21 (!) 146/87   Wt Readings from Last 3 Encounters:  12/24/21 155 lb 11.2 oz (70.6 kg)  11/16/21 153 lb 9.6 oz (69.7 kg)  10/08/21 155 lb 12.8 oz (70.7 kg)      Physical Exam Constitutional:      Appearance: Normal appearance. He is normal weight.  HENT:     Head: Normocephalic and atraumatic.  Cardiovascular:     Rate and Rhythm: Normal rate and  regular rhythm.     Pulses: Normal pulses.     Heart sounds: Normal heart sounds.  Pulmonary:     Effort: Pulmonary effort is normal.     Breath sounds: Normal breath sounds.  Skin:    General: Skin is warm and dry.  Neurological:     General: No focal deficit present.     Mental Status: He is alert and oriented to person, place, and time. Mental status is at baseline.  Psychiatric:        Mood and Affect: Mood normal.        Behavior: Behavior normal.        Thought Content: Thought content normal.      No results found for any visits on 12/24/21.  Last CBC Lab Results  Component Value Date   WBC 5.5 03/03/2020   HGB 13.7 03/03/2020   HCT 41.6 03/03/2020   MCV 92.7 03/03/2020   MCH 30.5 03/03/2020   RDW 12.8 03/03/2020   PLT 232 22/04/5425   Last metabolic panel Lab Results  Component Value Date   GLUCOSE 100 (H) 10/08/2021   NA 137 10/08/2021   K 3.1 (L) 10/08/2021   CL 97 10/08/2021   CO2 27 10/08/2021   BUN 20 10/08/2021   CREATININE 1.59 (H) 10/08/2021   EGFR 50 (L) 10/08/2021  CALCIUM 9.1 10/08/2021   PHOS 4.4 (H) 12/28/2018   PROT 7.1 01/01/2021   ALBUMIN 4.5 01/01/2021   LABGLOB 2.6 01/01/2021   AGRATIO 1.7 01/01/2021   BILITOT 0.4 01/01/2021   ALKPHOS 66 01/01/2021   AST 19 01/01/2021   ALT 16 01/01/2021   ANIONGAP 10 03/03/2020   Last lipids Lab Results  Component Value Date   CHOL 186 07/30/2021   HDL 52 07/30/2021   LDLCALC 107 (H) 07/30/2021   TRIG 157 (H) 07/30/2021   CHOLHDL 3.6 07/30/2021   Last hemoglobin A1c Lab Results  Component Value Date   HGBA1C 5.5 03/05/2021      The 10-year ASCVD risk score (Arnett DK, et al., 2019) is: 11.9%    Assessment & Plan:   1. Essential hypertension - Continue taking current medications. He was advised to continue checking his blood pressure at home. DASH diet and exercise as tolerated.  - amLODipine (NORVASC) 10 MG tablet; Take 1 tablet (10 mg total) by mouth daily.  Dispense: 90  tablet; Refill: 1 - losartan (COZAAR) 50 MG tablet; Take 1 tablet (50 mg total) by mouth daily.  Dispense: 90 tablet; Refill: 1 - Basic Metabolic Panel (BMET); Future  2. Dyslipidemia - Continue taking atorvastatin. Educated on low-cholesterol/low-fat diet.  - atorvastatin (LIPITOR) 40 MG tablet; Take 1 tablet (40 mg total) by mouth daily.  Dispense: 90 tablet; Refill: 1 - aspirin EC 81 MG tablet; Take 1 tablet (81 mg total) by mouth daily. Swallow whole.  Dispense: 30 tablet; Refill: 12  3. Blurry vision - He has not been to an eye doctor in several years and his eye glasses are broken. Given [facility] care form.    Follow-up in about three months, 04/14/2022   Rayvon Char

## 2021-12-24 NOTE — Patient Instructions (Signed)

## 2022-01-15 ENCOUNTER — Other Ambulatory Visit: Payer: Self-pay

## 2022-02-04 ENCOUNTER — Other Ambulatory Visit: Payer: Self-pay

## 2022-02-16 ENCOUNTER — Other Ambulatory Visit: Payer: Self-pay

## 2022-04-08 ENCOUNTER — Other Ambulatory Visit: Payer: Self-pay

## 2022-04-15 ENCOUNTER — Ambulatory Visit: Payer: Self-pay

## 2022-04-19 ENCOUNTER — Other Ambulatory Visit: Payer: Self-pay

## 2022-04-22 ENCOUNTER — Other Ambulatory Visit: Payer: Self-pay

## 2022-04-29 ENCOUNTER — Ambulatory Visit: Payer: Self-pay

## 2022-07-30 ENCOUNTER — Other Ambulatory Visit: Payer: Self-pay | Admitting: Gerontology

## 2022-07-30 ENCOUNTER — Other Ambulatory Visit: Payer: Self-pay

## 2022-07-30 DIAGNOSIS — E785 Hyperlipidemia, unspecified: Secondary | ICD-10-CM

## 2022-07-30 DIAGNOSIS — I10 Essential (primary) hypertension: Secondary | ICD-10-CM

## 2022-08-03 ENCOUNTER — Other Ambulatory Visit: Payer: Self-pay

## 2022-08-03 ENCOUNTER — Telehealth: Payer: Self-pay | Admitting: Emergency Medicine

## 2022-08-03 MED FILL — Amlodipine Besylate Tab 10 MG (Base Equivalent): ORAL | 30 days supply | Qty: 30 | Fill #0 | Status: CN

## 2022-08-03 MED FILL — Atorvastatin Calcium Tab 40 MG (Base Equivalent): ORAL | 30 days supply | Qty: 30 | Fill #0 | Status: CN

## 2022-08-03 MED FILL — Losartan Potassium Tab 50 MG: ORAL | 30 days supply | Qty: 30 | Fill #0 | Status: CN

## 2022-08-03 NOTE — Telephone Encounter (Signed)
Received secure chat from pharmacy stating patient has active [employer]. I have not been able to reach patient. Gave a 30 day supply until he gets in with new PCP. Requested pharmacy to leave patient a note with his medications to schedule with new PCP.

## 2022-08-03 NOTE — Telephone Encounter (Signed)
Patient must have OV prior to additional refills. Patient hasn't been seen since 12/2021. Patient has no showed several appointments.

## 2022-08-04 ENCOUNTER — Other Ambulatory Visit: Payer: Self-pay

## 2022-08-20 ENCOUNTER — Other Ambulatory Visit: Payer: Self-pay

## 2022-11-04 ENCOUNTER — Telehealth: Payer: Self-pay

## 2022-11-04 NOTE — Telephone Encounter (Signed)
Called pt to make an appt. Has not been seen in clinic in awhile. Both numbers unavailable

## 2022-11-11 ENCOUNTER — Ambulatory Visit: Payer: Self-pay | Admitting: Gerontology

## 2022-11-11 VITALS — BP 166/97 | HR 88 | Temp 98.6°F | Ht 64.57 in | Wt 173.1 lb

## 2022-11-11 DIAGNOSIS — I10 Essential (primary) hypertension: Secondary | ICD-10-CM

## 2022-11-11 DIAGNOSIS — E785 Hyperlipidemia, unspecified: Secondary | ICD-10-CM

## 2022-11-11 MED ORDER — LOSARTAN POTASSIUM 50 MG PO TABS
50.0000 mg | ORAL_TABLET | Freq: Every day | ORAL | 0 refills | Status: DC
Start: 2022-11-11 — End: 2022-12-08
  Filled 2022-11-11: qty 30, 30d supply, fill #0

## 2022-11-11 MED ORDER — ATORVASTATIN CALCIUM 40 MG PO TABS
40.0000 mg | ORAL_TABLET | Freq: Every day | ORAL | 0 refills | Status: DC
Start: 2022-11-11 — End: 2022-12-08
  Filled 2022-11-11: qty 30, 30d supply, fill #0

## 2022-11-11 MED ORDER — AMLODIPINE BESYLATE 10 MG PO TABS
10.0000 mg | ORAL_TABLET | Freq: Every day | ORAL | 0 refills | Status: DC
Start: 2022-11-11 — End: 2022-12-08
  Filled 2022-11-11: qty 30, 30d supply, fill #0

## 2022-11-11 NOTE — Progress Notes (Signed)
Established Patient Office Visit  Subjective   Patient ID: Paul Howard, male    DOB: Aug 18, 1960  Age: 62 y.o. MRN: [id number]  Chief Complaint  Patient presents with   Medication Refill    HPI  Paul Howard is a 62 y/o male with PMH of Stage 3 CKD, Asthma, HTN, presents for follow of up visit and medication refill. He states that he's out of all his medications since 3 months ago and did not call for refill.  He is not compliant with keeping his appointment.  His blood pressure was elevated at 166/97.  He denies chest pain, palpitation, lightheadedness, and vision changes.  Overall, he states that he is doing well and offers no further complaints.    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   Cervical radiculopathy 04/25/2020   Hypokalemia 10/12/2019   Healthcare maintenance 10/12/2019   Benign hypertensive kidney disease with chronic kidney disease 07/09/2019   Bilateral hip pain 02/01/2019   Stage 3b chronic kidney disease (HCC) 12/13/2018   Acute kidney failure (HCC) 12/13/2018   Back pain 12/29/2017   Acute epigastric pain 10/22/2014   AKI (acute kidney injury) (HCC) 10/22/2014   HTN (hypertension) 10/22/2014   Pancreatitis, alcoholic, acute 09/23/2014   Past Medical History:  Diagnosis Date   Acute epigastric pain 10/22/2014   AKI (acute kidney injury) (HCC) 10/22/2014   Arthritis    Asthma    HTN (hypertension) 10/22/2014   Hypertension    Pancreatitis    Pancreatitis, alcoholic, acute 09/23/2014   Past Surgical History:  Procedure Laterality Date   TONSILLECTOMY     Social History   Tobacco Use   Smoking status: Never   Smokeless tobacco: Never  Vaping Use    Vaping status: Never Used  Substance Use Topics   Alcohol use: Yes    Alcohol/week: 7.0 standard drinks of alcohol    Types: 7 Cans of beer per week    Comment: 1 beer/day   Drug use: Not Currently    Types: Marijuana    Comment: 2-3x/week, last use 10/2020   Family History  Problem Relation Age of Onset   Hypertension Mother    Throat cancer Father    Hypertension Sister    Other Maternal Grandmother        unknown medical history   Other Maternal Grandfather        unknown medical history   Other Paternal Grandmother        unknown medical history   Other Paternal Grandfather        unknown medical history   No Known Allergies    Review of Systems  Constitutional: Negative.   Eyes: Negative.   Respiratory: Negative.    Cardiovascular: Negative.   Skin: Negative.   Neurological: Negative.   Psychiatric/Behavioral: Negative.        Objective:     BP (!) 166/97 (BP Location: Right Arm, Patient Position: Sitting, Cuff Size: Large)   Pulse 88   Temp 98.6 F (37 C)   Ht 5' 4.57" (1.64 m)   Wt 173 lb 1.6 oz (78.5 kg)   SpO2 96%   BMI 29.19 kg/m  BP Readings from Last 3 Encounters:  11/11/22 (!) 166/97  12/24/21 122/76  11/16/21 (!) 177/104   Wt Readings from Last 3 Encounters:  11/11/22 173 lb 1.6 oz (78.5 kg)  12/24/21 155 lb 11.2 oz (70.6 kg)  11/16/21 153 lb 9.6 oz (69.7 kg)      Physical Exam HENT:     Head: Normocephalic and atraumatic.     Mouth/Throat:     Mouth: Mucous membranes are moist.  Eyes:     Pupils: Pupils are equal, round, and reactive to light.  Cardiovascular:     Rate and Rhythm: Normal rate and regular rhythm.     Pulses: Normal pulses.     Heart sounds: Normal heart sounds.  Pulmonary:     Effort: Pulmonary effort is normal.     Breath sounds: Normal breath sounds.  Skin:    General: Skin is warm.  Neurological:     General: No focal deficit present.     Mental Status: He is alert and oriented to person, place, and time.   Psychiatric:        Mood and Affect: Mood normal.        Behavior: Behavior normal.        Thought Content: Thought content normal.        Judgment: Judgment normal.      No results found for any visits on 11/11/22.  Last CBC Lab Results  Component Value Date   WBC 5.5 03/03/2020   HGB 13.7 03/03/2020   HCT 41.6 03/03/2020   MCV 92.7 03/03/2020   MCH 30.5 03/03/2020   RDW 12.8 03/03/2020   PLT 232 03/03/2020   Last metabolic panel Lab Results  Component Value Date   GLUCOSE 100 (H) 10/08/2021   NA 137 10/08/2021   K 3.1 (L) 10/08/2021   CL 97 10/08/2021   CO2 27 10/08/2021   BUN 20 10/08/2021   CREATININE 1.59 (H) 10/08/2021   EGFR 50 (L) 10/08/2021   CALCIUM 9.1 10/08/2021   PHOS 4.4 (H) 12/28/2018   PROT 7.1 01/01/2021   ALBUMIN 4.5 01/01/2021   LABGLOB 2.6 01/01/2021   AGRATIO 1.7 01/01/2021   BILITOT 0.4 01/01/2021   ALKPHOS 66 01/01/2021   AST 19 01/01/2021   ALT 16 01/01/2021   ANIONGAP 10 03/03/2020   Last lipids Lab Results  Component Value Date   CHOL 186 07/30/2021   HDL 52 07/30/2021   LDLCALC 107 (H) 07/30/2021   TRIG 157 (H) 07/30/2021   CHOLHDL 3.6 07/30/2021   Last hemoglobin A1c Lab Results  Component Value Date   HGBA1C 5.5 03/05/2021   Last thyroid functions Lab Results  Component Value Date   TSH 4.940 (H) 04/24/2020   Last vitamin D No results found for: "25OHVITD2", "25OHVITD3", "VD25OH"    The 10-year ASCVD risk score (Arnett DK, et al., 2019) is: 20.5%    Assessment & Plan:   1. Essential hypertension -His blood pressure is well-controlled, his goal should be less than 140/80.  He will continue on current medication and advised to call the pharmacy when he needs a refill or call the clinic.  He was encouraged to continue on DASH diet, exercise as tolerated.  He was educated on the signs and symptoms of stroke and advised to go to the emergency room. - amLODipine (NORVASC) 10 MG tablet; Take 1 tablet (10 mg total) by  mouth daily.  Dispense: 30 tablet; Refill: 0 - losartan (COZAAR) 50 MG tablet; Take 1 tablet (50 mg total) by mouth daily.  Dispense:  30 tablet; Refill: 0  2. Dyslipidemia -He will continue his current medication, encouraged to continue on low-fat/cholesterol diet and exercise as tolerated. - atorvastatin (LIPITOR) 40 MG tablet; Take 1 tablet (40 mg total) by mouth daily.  Dispense: 30 tablet; Refill: 0    Return in about 3 weeks (around 12/02/2022), or if symptoms worsen or fail to improve.    Chioma E Iloabachie, NP Completely out of medications, here for refill for amlodipine, aspirin, lipitor, and lorsartan. Last dose about 3 mo ago.

## 2022-11-11 NOTE — Patient Instructions (Signed)

## 2022-11-12 ENCOUNTER — Other Ambulatory Visit: Payer: Self-pay

## 2022-11-16 ENCOUNTER — Other Ambulatory Visit: Payer: Self-pay

## 2022-11-17 ENCOUNTER — Encounter (INDEPENDENT_AMBULATORY_CARE_PROVIDER_SITE_OTHER): Payer: Self-pay

## 2022-11-17 ENCOUNTER — Ambulatory Visit (INDEPENDENT_AMBULATORY_CARE_PROVIDER_SITE_OTHER): Payer: Self-pay | Admitting: Nurse Practitioner

## 2022-12-02 ENCOUNTER — Ambulatory Visit: Payer: Self-pay

## 2022-12-08 ENCOUNTER — Other Ambulatory Visit: Payer: Self-pay | Admitting: Gerontology

## 2022-12-08 ENCOUNTER — Other Ambulatory Visit: Payer: Self-pay

## 2022-12-08 DIAGNOSIS — I10 Essential (primary) hypertension: Secondary | ICD-10-CM

## 2022-12-08 DIAGNOSIS — E785 Hyperlipidemia, unspecified: Secondary | ICD-10-CM

## 2022-12-08 MED ORDER — AMLODIPINE BESYLATE 10 MG PO TABS
10.0000 mg | ORAL_TABLET | Freq: Every day | ORAL | 0 refills | Status: DC
Start: 2022-12-08 — End: 2022-12-16
  Filled 2022-12-08: qty 30, 30d supply, fill #0

## 2022-12-08 MED ORDER — LOSARTAN POTASSIUM 50 MG PO TABS
50.0000 mg | ORAL_TABLET | Freq: Every day | ORAL | 0 refills | Status: DC
Start: 2022-12-08 — End: 2022-12-16
  Filled 2022-12-08: qty 30, 30d supply, fill #0

## 2022-12-08 MED ORDER — ATORVASTATIN CALCIUM 40 MG PO TABS
40.0000 mg | ORAL_TABLET | Freq: Every day | ORAL | 0 refills | Status: DC
Start: 2022-12-08 — End: 2022-12-16
  Filled 2022-12-08: qty 30, 30d supply, fill #0

## 2022-12-15 ENCOUNTER — Other Ambulatory Visit (INDEPENDENT_AMBULATORY_CARE_PROVIDER_SITE_OTHER): Payer: Self-pay | Admitting: Vascular Surgery

## 2022-12-15 DIAGNOSIS — I6523 Occlusion and stenosis of bilateral carotid arteries: Secondary | ICD-10-CM

## 2022-12-16 ENCOUNTER — Encounter: Payer: Self-pay | Admitting: Gerontology

## 2022-12-16 ENCOUNTER — Ambulatory Visit: Payer: Self-pay | Admitting: Gerontology

## 2022-12-16 VITALS — BP 159/86 | HR 80 | Temp 99.0°F | Resp 18 | Ht 65.0 in | Wt 174.5 lb

## 2022-12-16 DIAGNOSIS — I10 Essential (primary) hypertension: Secondary | ICD-10-CM

## 2022-12-16 DIAGNOSIS — M25551 Pain in right hip: Secondary | ICD-10-CM

## 2022-12-16 DIAGNOSIS — E785 Hyperlipidemia, unspecified: Secondary | ICD-10-CM

## 2022-12-16 MED ORDER — LOSARTAN POTASSIUM 50 MG PO TABS
50.0000 mg | ORAL_TABLET | Freq: Every day | ORAL | 0 refills | Status: DC
  Filled 2022-12-17 – 2023-01-05 (×2): qty 30, 30d supply, fill #0

## 2022-12-16 MED ORDER — AMLODIPINE BESYLATE 10 MG PO TABS
10.0000 mg | ORAL_TABLET | Freq: Every day | ORAL | 0 refills | Status: DC
  Filled 2022-12-17 – 2023-01-05 (×2): qty 30, 30d supply, fill #0

## 2022-12-16 MED ORDER — ATORVASTATIN CALCIUM 40 MG PO TABS
40.0000 mg | ORAL_TABLET | Freq: Every day | ORAL | 0 refills | Status: DC
  Filled 2022-12-17 – 2023-01-05 (×2): qty 30, 30d supply, fill #0

## 2022-12-16 NOTE — Progress Notes (Deleted)
Established Patient Office Visit  Subjective   Patient ID: Paul Howard, male    DOB: 03-08-1961  Age: 62 y.o. MRN: [id number]  Chief Complaint  Patient presents with   Follow-up   Hypertension    Hypertension    Paul Howard is a 62 y/o male with PMH of Stage 3 CKD, Asthma, HTN, presents for follow of up of hypertension. He reports checking his blood pressure once a day in the morning, ranging from SBP-150-170 and DBP- 85-90. He states that he ran out of amlodipine, Lipitor, and losartan on 12/15/2022. He states He denies any chest pain, SOB, heart palpitations, or changes to his vision. He rates his bilateral leg pain is at a 10/10 on the pain scale, and receiving minimal to no relief with the taking tylenol. He states that he does not have a diet but consumes vegetables, fruits, and drinks about 2-3 bottles of water per day. Overall he is doing well and offers no further complaint.   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   Cervical radiculopathy 04/25/2020   Hypokalemia 10/12/2019   Healthcare maintenance 10/12/2019   Benign hypertensive kidney disease with chronic kidney disease 07/09/2019   Bilateral hip pain 02/01/2019   Stage 3b chronic kidney disease (HCC) 12/13/2018   Acute kidney failure (HCC) 12/13/2018   Back pain 12/29/2017   Acute epigastric pain 10/22/2014   AKI (acute kidney injury) (HCC) 10/22/2014   HTN (hypertension) 10/22/2014   Pancreatitis, alcoholic, acute 09/23/2014   Past Medical History:  Diagnosis Date   Acute epigastric pain 10/22/2014   AKI (acute kidney injury) (HCC) 10/22/2014   Arthritis    Asthma    HTN (hypertension) 10/22/2014   Hypertension     Pancreatitis    Pancreatitis, alcoholic, acute 09/23/2014   Past Surgical History:  Procedure Laterality Date   TONSILLECTOMY     Social History   Tobacco Use   Smoking status: Never   Smokeless tobacco: Never  Vaping Use   Vaping status: Never Used  Substance Use Topics   Alcohol use: Yes    Alcohol/week: 7.0 standard drinks of alcohol    Types: 7 Cans of beer per week    Comment: 1 beer/day   Drug use: Not Currently    Types: Marijuana    Comment: 2-3x/week, last use 10/2020   Family History  Problem Relation Age of Onset   Hypertension Mother    Throat cancer Father    Hypertension Sister    Other Maternal Grandmother        unknown medical history   Other Maternal Grandfather        unknown medical history   Other Paternal Grandmother        unknown medical history   Other Paternal Grandfather        unknown medical history   No Known Allergies    Review of Systems  Constitutional: Negative.   HENT: Negative.    Eyes: Negative.   Respiratory: Negative.    Cardiovascular: Negative.   Gastrointestinal: Negative.   Genitourinary: Negative.   Musculoskeletal:  Positive for joint pain.       Bilateral lower extremity pain 10/10   Skin: Negative.  Neurological: Negative.   Endo/Heme/Allergies: Negative.   Psychiatric/Behavioral: Negative.        Objective:     BP (!) 170/99 (BP Location: Right Arm, Patient Position: Sitting, Cuff Size: Normal)   Pulse 80   Temp 99 F (37.2 C)   Resp 18   Ht 5\' 5"  (1.651 m)   Wt 174 lb 8 oz (79.2 kg)   SpO2 96%   BMI 29.04 kg/m  BP Readings from Last 3 Encounters:  12/16/22 (!) 170/99  11/11/22 (!) 166/97  12/24/21 122/76   Wt Readings from Last 3 Encounters:  12/16/22 174 lb 8 oz (79.2 kg)  11/11/22 173 lb 1.6 oz (78.5 kg)  12/24/21 155 lb 11.2 oz (70.6 kg)      Physical Exam Constitutional:      Appearance: Normal appearance. He is normal weight.  HENT:     Head: Normocephalic and atraumatic.      Nose: Nose normal.     Mouth/Throat:     Mouth: Mucous membranes are moist.     Pharynx: Oropharynx is clear.  Eyes:     Extraocular Movements: Extraocular movements intact.     Conjunctiva/sclera: Conjunctivae normal.  Cardiovascular:     Rate and Rhythm: Normal rate and regular rhythm.     Pulses: Normal pulses.     Heart sounds: Normal heart sounds.  Pulmonary:     Effort: Pulmonary effort is normal.     Breath sounds: Normal breath sounds.  Abdominal:     General: Abdomen is flat. Bowel sounds are normal.  Musculoskeletal:     Cervical back: Normal range of motion.     Comments: Limited ROM bilateral lower extremities.  Shuffled gait with assistance of a cane.   Skin:    General: Skin is warm and dry.  Neurological:     General: No focal deficit present.     Mental Status: He is alert and oriented to person, place, and time. Mental status is at baseline.  Psychiatric:        Mood and Affect: Mood normal.        Behavior: Behavior normal.        Thought Content: Thought content normal.        Judgment: Judgment normal.      No results found for any visits on 12/16/22.  Last CBC Lab Results  Component Value Date   WBC 5.5 03/03/2020   HGB 13.7 03/03/2020   HCT 41.6 03/03/2020   MCV 92.7 03/03/2020   MCH 30.5 03/03/2020   RDW 12.8 03/03/2020   PLT 232 03/03/2020   Last metabolic panel Lab Results  Component Value Date   GLUCOSE 100 (H) 10/08/2021   NA 137 10/08/2021   K 3.1 (L) 10/08/2021   CL 97 10/08/2021   CO2 27 10/08/2021   BUN 20 10/08/2021   CREATININE 1.59 (H) 10/08/2021   EGFR 50 (L) 10/08/2021   CALCIUM 9.1 10/08/2021   PHOS 4.4 (H) 12/28/2018   PROT 7.1 01/01/2021   ALBUMIN 4.5 01/01/2021   LABGLOB 2.6 01/01/2021   AGRATIO 1.7 01/01/2021   BILITOT 0.4 01/01/2021   ALKPHOS 66 01/01/2021   AST 19 01/01/2021   ALT 16 01/01/2021   ANIONGAP 10 03/03/2020   Last lipids Lab Results  Component Value Date   CHOL 186 07/30/2021   HDL 52  07/30/2021   LDLCALC 107 (H) 07/30/2021   TRIG 157 (H) 07/30/2021   CHOLHDL 3.6 07/30/2021   Last hemoglobin A1c Lab Results  Component Value Date   HGBA1C 5.5 03/05/2021   Last thyroid functions Lab Results  Component Value Date   TSH 4.940 (H) 04/24/2020      The 10-year ASCVD risk score (Arnett DK, et al., 2019) is: 22.1%    Assessment & Plan:  1. Essential hypertension -His blood pressure is uncntrolled. His goal is less than 140/90 - amLODipine (NORVASC) 10 MG tablet; Take 1 tablet (10 mg total) by mouth daily.  Dispense: 30 tablet; Refill: 0 - losartan (COZAAR) 50 MG tablet; Take 1 tablet (50 mg total) by mouth daily.  Dispense: 30 tablet; Refill: 0  2. Dyslipidemia *** - atorvastatin (LIPITOR) 40 MG tablet; Take 1 tablet (40 mg total) by mouth daily.  Dispense: 30 tablet; Refill: 0  Problem List Items Addressed This Visit   None   Follow up in clinic on or around 01/13/2023, or if symptoms worsen or fail to improve.    Nilsa Nutting

## 2022-12-16 NOTE — Progress Notes (Signed)
Established Patient Office Visit  Subjective   Patient ID: Paul Howard, male    DOB: 11-18-1960  Age: 62 y.o. MRN: [id number]  Chief Complaint  Patient presents with   Follow-up   Hypertension    Hypertension  Paul Howard is a 62 y/o male with PMH of Stage 3 CKD, Asthma, HTN, presents for follow of up of hypertension. He reports checking his blood pressure once a day in the morning, ranging from SBP-150-170 and DBP- 85-90. He states that he ran out of amlodipine, Lipitor, and losartan on 12/15/2022. He denies any chest pain, SOB, heart palpitations, or changes to his vision. He rates his bilateral leg pain is at a 10/10 on the pain scale, and receiving minimal to no relief with the taking tylenol. He denies muscle nor motor weakness. He states that he does not have a diet but consumes vegetables, fruits, and drinks about 2-3 bottles of water per day. Overall he is doing well and offers no further complaint.       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   Cervical radiculopathy 04/25/2020   Hypokalemia 10/12/2019   Healthcare maintenance 10/12/2019   Benign hypertensive kidney disease with chronic kidney disease 07/09/2019   Bilateral hip pain 02/01/2019   Stage 3b chronic kidney disease (HCC) 12/13/2018   Acute kidney failure (HCC) 12/13/2018   Back pain 12/29/2017   Acute epigastric pain 10/22/2014   AKI (acute kidney injury) (HCC) 10/22/2014   HTN (hypertension) 10/22/2014   Pancreatitis, alcoholic, acute 09/23/2014   Past Medical History:  Diagnosis Date   Acute epigastric pain 10/22/2014   AKI (acute kidney injury) (HCC) 10/22/2014   Arthritis    Asthma    HTN  (hypertension) 10/22/2014   Hypertension    Pancreatitis    Pancreatitis, alcoholic, acute 09/23/2014   Past Surgical History:  Procedure Laterality Date   TONSILLECTOMY     Social History   Tobacco Use   Smoking status: Never   Smokeless tobacco: Never  Vaping Use   Vaping status: Never Used  Substance Use Topics   Alcohol use: Yes    Alcohol/week: 7.0 standard drinks of alcohol    Types: 7 Cans of beer per week    Comment: 1 beer/day   Drug use: Not Currently    Types: Marijuana    Comment: 2-3x/week, last use 10/2020   Family History  Problem Relation Age of Onset   Hypertension Mother    Throat cancer Father    Hypertension Sister    Other Maternal Grandmother        unknown medical history   Other Maternal Grandfather        unknown medical history   Other Paternal Grandmother        unknown medical history   Other Paternal Grandfather        unknown medical history   No Known Allergies    Review of Systems  Constitutional: Negative.   HENT: Negative.    Eyes: Negative.   Respiratory: Negative.    Cardiovascular: Negative.   Gastrointestinal: Negative.   Genitourinary: Negative.   Musculoskeletal:  Positive for joint pain.       Bilateral lower extremity pain 10/10  Skin: Negative.   Neurological: Negative.   Endo/Heme/Allergies: Negative.   Psychiatric/Behavioral: Negative.        Objective:     BP (!) 159/86 (BP Location: Left Arm, Patient Position: Sitting, Cuff Size: Normal)   Pulse 80   Temp 99 F (37.2 C)   Resp 18   Ht 5\' 5"  (1.651 m)   Wt 174 lb 8 oz (79.2 kg)   SpO2 96%   BMI 29.04 kg/m  BP Readings from Last 3 Encounters:  12/16/22 (!) 159/86  11/11/22 (!) 166/97  12/24/21 122/76   Wt Readings from Last 3 Encounters:  12/16/22 174 lb 8 oz (79.2 kg)  11/11/22 173 lb 1.6 oz (78.5 kg)  12/24/21 155 lb 11.2 oz (70.6 kg)      Physical Exam Constitutional:      Appearance: Normal appearance. He is normal weight.  HENT:      Head: Normocephalic and atraumatic.     Nose: Nose normal.     Mouth/Throat:     Mouth: Mucous membranes are moist.     Pharynx: Oropharynx is clear.  Eyes:     Extraocular Movements: Extraocular movements intact.     Conjunctiva/sclera: Conjunctivae normal.  Cardiovascular:     Rate and Rhythm: Normal rate and regular rhythm.     Pulses: Normal pulses.     Heart sounds: Normal heart sounds.  Pulmonary:     Effort: Pulmonary effort is normal.     Breath sounds: Normal breath sounds.  Abdominal:     General: Abdomen is flat. Bowel sounds are normal.  Musculoskeletal:     Cervical back: Normal range of motion.     Comments: Limited ROM bilateral lower extremities.  Shuffled gait with assistance of a cane.   Skin:    General: Skin is warm and dry.  Neurological:     General: No focal deficit present.     Mental Status: He is alert and oriented to person, place, and time. Mental status is at baseline.  Psychiatric:        Mood and Affect: Mood normal.        Behavior: Behavior normal.        Thought Content: Thought content normal.        Judgment: Judgment normal.      No results found for any visits on 12/16/22.  Last CBC Lab Results  Component Value Date   WBC 5.5 03/03/2020   HGB 13.7 03/03/2020   HCT 41.6 03/03/2020   MCV 92.7 03/03/2020   MCH 30.5 03/03/2020   RDW 12.8 03/03/2020   PLT 232 03/03/2020   Last metabolic panel Lab Results  Component Value Date   GLUCOSE 100 (H) 10/08/2021   NA 137 10/08/2021   K 3.1 (L) 10/08/2021   CL 97 10/08/2021   CO2 27 10/08/2021   BUN 20 10/08/2021   CREATININE 1.59 (H) 10/08/2021   EGFR 50 (L) 10/08/2021   CALCIUM 9.1 10/08/2021   PHOS 4.4 (H) 12/28/2018   PROT 7.1 01/01/2021   ALBUMIN 4.5 01/01/2021   LABGLOB 2.6 01/01/2021   AGRATIO 1.7 01/01/2021   BILITOT 0.4 01/01/2021   ALKPHOS 66 01/01/2021   AST 19 01/01/2021   ALT 16 01/01/2021   ANIONGAP 10 03/03/2020   Last lipids Lab Results  Component Value  Date   CHOL 186 07/30/2021   HDL 52 07/30/2021   LDLCALC 107 (H) 07/30/2021   TRIG 157 (H) 07/30/2021   CHOLHDL 3.6 07/30/2021   Last  hemoglobin A1c Lab Results  Component Value Date   HGBA1C 5.5 03/05/2021   Last thyroid functions Lab Results  Component Value Date   TSH 4.940 (H) 04/24/2020      The 10-year ASCVD risk score (Arnett DK, et al., 2019) is: 19.7%    Assessment & Plan:  1. Essential hypertension -His blood pressure is uncntrolled. His goal is less than 140/90. His amlodipine 10 mg and losartan 50 mg was refilled. He was encouraged to follow a DASH diet, increase water intake, and exercise as tolerated. He was educated on signs and symptoms of stroke and advised to go to the ED if symptoms present. - amLODipine (NORVASC) 10 MG tablet; Take 1 tablet (10 mg total) by mouth daily.  Dispense: 30 tablet; Refill: 0 - losartan (COZAAR) 50 MG tablet; Take 1 tablet (50 mg total) by mouth daily.  Dispense: 30 tablet; Refill: 0  2. Dyslipidemia - His lipitor 40 mg was refilled. He will continue on current medication, follow a low-fat cholesterol diet, and exercise as tolerated.  - atorvastatin (LIPITOR) 40 MG tablet; Take 1 tablet (40 mg total) by mouth daily.  Dispense: 30 tablet; Refill: 0   3. Bilateral hip pain - He states that he will schedule an Orthopedic appointment since he has active Medicaid. He was advised to go to the ED for worsening pain.    Follow up in clinic on or around 01/13/2023, or if symptoms worsen or fail to improve.    Nilsa Nutting

## 2022-12-16 NOTE — Patient Instructions (Signed)

## 2022-12-17 ENCOUNTER — Other Ambulatory Visit: Payer: Self-pay

## 2022-12-20 ENCOUNTER — Encounter (INDEPENDENT_AMBULATORY_CARE_PROVIDER_SITE_OTHER): Payer: Self-pay

## 2022-12-20 ENCOUNTER — Ambulatory Visit (INDEPENDENT_AMBULATORY_CARE_PROVIDER_SITE_OTHER): Payer: Self-pay | Admitting: Nurse Practitioner

## 2022-12-27 ENCOUNTER — Other Ambulatory Visit: Payer: Self-pay

## 2023-01-05 ENCOUNTER — Other Ambulatory Visit: Payer: Self-pay

## 2023-01-13 ENCOUNTER — Ambulatory Visit: Payer: 59

## 2023-01-17 ENCOUNTER — Other Ambulatory Visit: Payer: Self-pay

## 2023-02-25 ENCOUNTER — Ambulatory Visit: Payer: 59 | Admitting: Internal Medicine

## 2023-05-31 ENCOUNTER — Other Ambulatory Visit: Payer: Self-pay

## 2023-05-31 ENCOUNTER — Ambulatory Visit: Payer: Self-pay | Admitting: Family Medicine

## 2023-05-31 ENCOUNTER — Encounter: Payer: Self-pay | Admitting: Family Medicine

## 2023-05-31 VITALS — BP 178/103 | HR 97 | Resp 18 | Ht 65.0 in | Wt 175.0 lb

## 2023-05-31 DIAGNOSIS — E785 Hyperlipidemia, unspecified: Secondary | ICD-10-CM

## 2023-05-31 DIAGNOSIS — Z1211 Encounter for screening for malignant neoplasm of colon: Secondary | ICD-10-CM

## 2023-05-31 DIAGNOSIS — M25551 Pain in right hip: Secondary | ICD-10-CM

## 2023-05-31 DIAGNOSIS — J4520 Mild intermittent asthma, uncomplicated: Secondary | ICD-10-CM

## 2023-05-31 DIAGNOSIS — J301 Allergic rhinitis due to pollen: Secondary | ICD-10-CM

## 2023-05-31 DIAGNOSIS — F1410 Cocaine abuse, uncomplicated: Secondary | ICD-10-CM

## 2023-05-31 DIAGNOSIS — N1831 Chronic kidney disease, stage 3a: Secondary | ICD-10-CM | POA: Diagnosis not present

## 2023-05-31 DIAGNOSIS — I779 Disorder of arteries and arterioles, unspecified: Secondary | ICD-10-CM

## 2023-05-31 DIAGNOSIS — Z7689 Persons encountering health services in other specified circumstances: Secondary | ICD-10-CM

## 2023-05-31 DIAGNOSIS — R946 Abnormal results of thyroid function studies: Secondary | ICD-10-CM

## 2023-05-31 DIAGNOSIS — Z8673 Personal history of transient ischemic attack (TIA), and cerebral infarction without residual deficits: Secondary | ICD-10-CM | POA: Diagnosis not present

## 2023-05-31 DIAGNOSIS — I10 Essential (primary) hypertension: Secondary | ICD-10-CM | POA: Diagnosis not present

## 2023-05-31 DIAGNOSIS — I6501 Occlusion and stenosis of right vertebral artery: Secondary | ICD-10-CM

## 2023-05-31 DIAGNOSIS — M25552 Pain in left hip: Secondary | ICD-10-CM

## 2023-05-31 DIAGNOSIS — Z8719 Personal history of other diseases of the digestive system: Secondary | ICD-10-CM

## 2023-05-31 MED ORDER — ATORVASTATIN CALCIUM 40 MG PO TABS
40.0000 mg | ORAL_TABLET | Freq: Every day | ORAL | 0 refills | Status: AC
  Filled 2023-05-31: qty 30, 30d supply, fill #0

## 2023-05-31 MED ORDER — AMLODIPINE BESYLATE 10 MG PO TABS
10.0000 mg | ORAL_TABLET | Freq: Every day | ORAL | 0 refills | Status: DC
  Filled 2023-05-31: qty 30, 30d supply, fill #0

## 2023-05-31 MED ORDER — CETIRIZINE HCL 10 MG PO TABS
10.0000 mg | ORAL_TABLET | Freq: Every day | ORAL | 11 refills | Status: AC
  Filled 2023-05-31: qty 30, 30d supply, fill #0

## 2023-05-31 MED ORDER — ALBUTEROL SULFATE HFA 108 (90 BASE) MCG/ACT IN AERS
2.0000 | INHALATION_SPRAY | Freq: Four times a day (QID) | RESPIRATORY_TRACT | 2 refills | Status: AC | PRN
  Filled 2023-05-31: qty 6.7, 25d supply, fill #0

## 2023-05-31 MED ORDER — ASPIRIN 81 MG PO TBEC
81.0000 mg | DELAYED_RELEASE_TABLET | Freq: Every day | ORAL | 12 refills | Status: AC
  Filled 2023-05-31: qty 30, 30d supply, fill #0

## 2023-05-31 MED ORDER — LOSARTAN POTASSIUM 50 MG PO TABS
50.0000 mg | ORAL_TABLET | Freq: Every day | ORAL | 0 refills | Status: DC
  Filled 2023-05-31: qty 30, 30d supply, fill #0

## 2023-05-31 NOTE — Progress Notes (Signed)
 New patient visit   Patient: Paul Howard   DOB: [birth year]   63 y.o. Male  MRN: [id number] Visit Date: 05/31/2023  Today's healthcare provider: Sherlyn Hay, DO   Chief Complaint  Patient presents with   Establish Care   Subjective    Paul Howard is a 63 y.o. male who presents today as a new patient to establish care.  HPI  The patient presents to establish care and restart medications after a lapse in follow-up.  He has not had follow-up for approximately four to five months and has been out of his medications, including atorvastatin and amlodipine. He also uses Tylenol as needed, which he obtains over the counter.  He has a history of high blood pressure, which was noted to be elevated during today's visit. No chest pain, shortness of breath, headaches, vision changes, dizziness, lightheadedness, nausea, vomiting, belly pain, diarrhea, or constipation. He occasionally experiences a sensation of his heart racing, which occurs both at work and while sitting, but it resolves with deep breathing.  He has a history of a stroke due to a blood clot in his neck, which was discovered incidentally during an MRI for a pinched nerve. He has not had any recent follow-up with vascular specialists since 2023.  He has chronic kidney disease, which was attributed to the use of naproxen for leg pain. 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 He has not received any injections for this pain.  He has a history of asthma, which seems to have improved over time but is exacerbated during pollen season, causing wheezing. He does not currently have an inhaler but uses Benadryl and allergy relief medications from [facility] for his symptoms.  He has a history of cocaine use, which he reports using about once every week or two.  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 He has stopped  consuming liquor and reports he has no issues abstaining from it.   Past Medical History:  Diagnosis Date   Acute epigastric pain 10/22/2014   AKI (acute kidney injury) (HCC) 10/22/2014   Allergy    Arthritis    Asthma    HTN (hypertension) 10/22/2014   Hypertension    Pancreatitis    Pancreatitis, alcoholic, acute 09/23/2014   Pancreatitis, alcoholic, acute 09/23/2014   Stroke (HCC)    Substance abuse (HCC)    Past Surgical History:  Procedure Laterality Date   TONSILLECTOMY     Family Status  Relation Name Status   Mother  Alive   Father  Deceased   Sister  Alive   MGM  Deceased   MGF  Deceased   PGM  Deceased   PGF  Deceased  No partnership data on file   Family History  Problem Relation Age of Onset   Hypertension Mother    Throat cancer Father    Hypertension Sister    Other Maternal Grandmother        unknown medical history   Other Maternal Grandfather        unknown medical history   Other Paternal Grandmother        unknown medical history   Other Paternal Grandfather        unknown medical history   Social History   Socioeconomic History   Marital status: Single    Spouse name: Not on file   Number of children: Not  on file   Years of education: 11   Highest education level: 11th grade  Occupational History   Occupation: Warden/ranger  Tobacco Use   Smoking status: Never   Smokeless tobacco: Never  Vaping Use   Vaping status: Never Used  Substance and Sexual Activity   Alcohol use: Yes    Alcohol/week: 7.0 standard drinks of alcohol    Types: 7 Cans of beer per week    Comment: 1 beer/day   Drug use: Yes    Types: Marijuana, Cocaine    Comment: 2-3x/week, last use 10/2020   Sexual activity: Not on file  Other Topics Concern   Not on file  Social History Narrative   Not on food stamps, hasn't applied. No transportation. Walks everywhere. Close to work, but far from paying rent and grocery.   Social Drivers of Health   Financial  Resource Strain: High Risk (12/13/2017)   Overall Financial Resource Strain (CARDIA)    Difficulty of Paying Living Expenses: Hard  Food Insecurity: No Food Insecurity (11/11/2022)   Hunger Vital Sign    Worried About Running Out of Food in the Last Year: Never true    Ran Out of Food in the Last Year: Never true  Transportation Needs: No Transportation Needs (11/11/2022)   PRAPARE - Administrator, Civil Service (Medical): No    Lack of Transportation (Non-Medical): No  Physical Activity: Inactive (06/26/2020)   Exercise Vital Sign    Days of Exercise per Week: 0 days    Minutes of Exercise per Session: 0 min  Stress: No Stress Concern Present (06/26/2020)   [employer] of Occupational Health - Occupational Stress Questionnaire    Feeling of Stress : Not at all  Social Connections: Unknown (10/26/2018)   Social Connection and Isolation Panel [NHANES]    Frequency of Communication with Friends and Family: Twice a week    Frequency of Social Gatherings with Friends and Family: Twice a week    Attends Religious Services: Never    Database administrator or Organizations: No    Attends Engineer, structural: Never    Marital Status: Not on file   Outpatient Medications Prior to Visit  Medication Sig   acetaminophen (TYLENOL) 650 MG CR tablet Take 650 mg by mouth every 8 (eight) hours as needed for pain (arthritis). (Patient not taking: Reported on 05/31/2023)   Blood Pressure KIT 1 kit by Does not apply route daily. (Patient not taking: Reported on 05/31/2023)   [DISCONTINUED] amLODipine (NORVASC) 10 MG tablet Take 1 tablet (10 mg total) by mouth daily. (Patient not taking: Reported on 05/31/2023)   [DISCONTINUED] aspirin EC 81 MG tablet Take 1 tablet (81 mg total) by mouth daily. Swallow whole. (Patient not taking: Reported on 05/31/2023)   [DISCONTINUED] atorvastatin (LIPITOR) 40 MG tablet Take 1 tablet (40 mg total) by mouth daily. (Patient not taking: Reported on  05/31/2023)   [DISCONTINUED] losartan (COZAAR) 50 MG tablet Take 1 tablet (50 mg total) by mouth daily. (Patient not taking: Reported on 05/31/2023)   No facility-administered medications prior to visit.   No Known Allergies  Immunization History  Administered Date(s) Administered   Influenza,inj,quad, With Preservative 01/02/2019   Unspecified SARS-COV-2 Vaccination 05/31/2019, 06/28/2019    Health Maintenance  Topic Date Due   Pneumococcal Vaccine 11-55 Years old (1 of 2 - PCV) Never done   HIV Screening  Never done   Fecal DNA (Cologuard)  Never done   INFLUENZA VACCINE  06/20/2023 (Originally 10/21/2022)   Zoster Vaccines- Shingrix (1 of 2) 09/09/2023 (Originally 12/08/2011)   COVID-19 Vaccine (3 - 2024-25 season) 12/21/2023 (Originally 11/21/2022)   DTaP/Tdap/Td (1 - Tdap) 06/08/2024 (Originally 12/07/1980)   Hepatitis C Screening  Completed   HPV VACCINES  Aged Out    Patient Care Team: Giovanna Kemmerer N, DO as PCP - General (Family Medicine)       Objective    BP (!) 178/103   Pulse 97   Resp 18   Ht 5\' 5"  (1.651 m)   Wt 175 lb (79.4 kg)   SpO2 100%   BMI 29.12 kg/m     Physical Exam Vitals and nursing note reviewed.  Constitutional:      General: He is not in acute distress.    Appearance: Normal appearance.  HENT:     Head: Normocephalic and atraumatic.  Eyes:     General: No scleral icterus.    Conjunctiva/sclera: Conjunctivae normal.  Cardiovascular:     Rate and Rhythm: Normal rate.  Pulmonary:     Effort: Pulmonary effort is normal.  Neurological:     Mental Status: He is alert and oriented to person, place, and time. Mental status is at baseline.  Psychiatric:        Mood and Affect: Mood normal.        Behavior: Behavior normal.     03/03/2020 MRI Brain wo contrast:  IMPRESSION: 1. Occlusion of the right vertebral artery from its origin to the V4 segment where it is reconstituted by collateral flow across the vertebrobasilar confluence. 2.  No acute intracranial abnormality. 3. Old right cerebellar infarct and findings of chronic small vessel disease.   Depression Screen    05/31/2023    3:14 PM 10/08/2021    2:46 PM  PHQ 2/9 Scores  PHQ - 2 Score 3 0  PHQ- 9 Score 3    No results found for any visits on 05/31/23.  Assessment & Plan     Chronic kidney disease, stage 3a (HCC) Assessment & Plan: Noted. Will check blood work at follow up. - Avoid nephrotoxic medications, including NSAIDs, where possible - Consider alternative pain management options such as capsaicin cream or lidocaine cream   Establishing care with new doctor, encounter for Assessment & Plan: Due for several vaccinations and screenings. Has not received the flu vaccine, COVID booster, pneumonia vaccine, or shingles vaccine. Open to Cologuard testing as an alternative to colonoscopy for colon cancer screening. - Declined flu vaccine, COVID booster, pneumonia vaccine, and shingles vaccine - Order Cologuard kit for colon cancer screening   Dyslipidemia Assessment & Plan: Refill atorvastatin. Will check blood work on next visit.  Orders: -     Atorvastatin Calcium; Take 1 tablet (40 mg total) by mouth daily.  Dispense: 30 tablet; Refill: 0 -     Aspirin; Take 1 tablet (81 mg total) by mouth daily. Swallow whole.  Dispense: 30 tablet; Refill: 12  Essential hypertension Assessment & Plan: Uncontrolled hypertension likely due to a lapse in medication adherence for the past 4-5 months. Blood pressure was elevated during the visit. - Restart atorvastatin and amlodipine - Follow up in 4-6 weeks to assess blood pressure control - Bring blood pressure log and cuff to next appointment  Orders: -     Losartan Potassium; Take 1 tablet (50 mg total) by mouth daily.  Dispense: 30 tablet; Refill: 0 -     amLODIPine Besylate; Take 1 tablet (10 mg total) by mouth daily.  Dispense: 30 tablet; Refill: 0  History of stroke Assessment & Plan: Followed with  vascular surgery. On last check 11/16/2021, Duplex ultrasound of the carotid and vertebral arteries showed: - RICA<40% and LICA 40-59%  - antegrade flow in the left vertebral artery  - known occlusion of the right vertebral artery re-demonstrated.  Refer back to vascular for continued monitored and recommendations.  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 Last vascular follow-up was in 2023, and a follow-up was recommended but not completed. - Send referral to vascular specialist for follow-up, due to requirement of new insurance.  Orders: -     Ambulatory referral to Vascular Surgery  Vertebral artery occlusion, right Assessment & Plan: Addressed as noted above.   Orders: -     Ambulatory referral to Vascular Surgery  Bilateral hip pain Assessment & Plan: Osteoarthritis with significant hip pain, previously managed with NSAIDs. Interested in exploring further treatment options. - Send referral to orthopedics for further evaluation and management - Discuss alternative pain management options such as capsaicin cream or lidocaine cream  Orders: -     Ambulatory referral to Orthopedic Surgery  Mild intermittent extrinsic asthma without complication Assessment & Plan: Asthma with seasonal exacerbations, particularly during pollen season, causing wheezing. An inhaler is prescribed for symptomatic relief, and Zyrtec is recommended for allergy management. - Prescribe inhaler for wheezing, to be used every 6 hours as needed - Recommend Zyrtec for allergy management  Orders: -     Albuterol Sulfate HFA; Inhale 2 puffs into the lungs every 6 (six) hours as needed for wheezing or shortness of breath.  Dispense: 6.7 g; Refill: 2  Seasonal allergic rhinitis due to pollen Assessment & Plan: Addressed as noted above.   Orders: -     Cetirizine HCl; Take 1 tablet (10 mg  total) by mouth daily.  Dispense: 30 tablet; Refill: 11  Encounter for colorectal cancer screening -     Cologuard  Cocaine abuse, episodic use (HCC) Assessment & Plan: Intermittent cocaine use, approximately once every one to two weeks. Aware of the risks, including the potential for cardiovascular events, and has expressed a desire to stop. - Encourage cessation of cocaine use due to risk of cardiovascular events - Offer referral to Child psychotherapist for support if interested. Patient declined for the time being.   History of acute pancreatitis Assessment & Plan: Alcoholic pancreatitis with two episodes (one as teenager and one in his fifties). He has stopped consuming liquor and is aware of the risks associated with alcohol use. - Continue to abstain from alcohol     Follow-up Follow-up is necessary to monitor blood pressure control and ensure adherence to treatment plans. Blood draw and HIV screening are planned for the next visit to consolidate testing and minimize blood draws. - Schedule follow-up appointment in 4-6 weeks - Plan for blood draw and HIV screening at follow-up visit  Return in about 6 weeks (around 07/12/2023) for HTN, CPE.     I discussed the assessment and treatment plan with the patient  The patient was provided an opportunity to ask questions and all were answered. The patient agreed with the plan and demonstrated an understanding of the instructions.   The patient was advised to call back or seek an in-person evaluation if the symptoms worsen or if the condition fails to improve as anticipated.    Niles Ess N Dori Devino, DO  [facility] [phone] (phone) [phone] (fax)  [facility]

## 2023-05-31 NOTE — Patient Instructions (Addendum)
 Call vascular surgery to schedule follow up for the arteries in your neck.  Can use capsaicin or lidocaine cream

## 2023-06-09 ENCOUNTER — Encounter: Payer: Self-pay | Admitting: Family Medicine

## 2023-06-09 DIAGNOSIS — J45909 Unspecified asthma, uncomplicated: Secondary | ICD-10-CM | POA: Insufficient documentation

## 2023-06-09 DIAGNOSIS — J301 Allergic rhinitis due to pollen: Secondary | ICD-10-CM | POA: Insufficient documentation

## 2023-06-09 DIAGNOSIS — F1410 Cocaine abuse, uncomplicated: Secondary | ICD-10-CM | POA: Insufficient documentation

## 2023-06-09 DIAGNOSIS — Z8719 Personal history of other diseases of the digestive system: Secondary | ICD-10-CM | POA: Insufficient documentation

## 2023-06-09 NOTE — Assessment & Plan Note (Signed)
 Alcoholic pancreatitis with two episodes (one as teenager and one in his fifties). He has stopped consuming liquor and is aware of the risks associated with alcohol use. - Continue to abstain from alcohol

## 2023-06-09 NOTE — Assessment & Plan Note (Signed)
 Addressed as noted above.

## 2023-06-09 NOTE — Assessment & Plan Note (Signed)
 Due for several vaccinations and screenings. Has not received the flu vaccine, COVID booster, pneumonia vaccine, or shingles vaccine. Open to Cologuard testing as an alternative to colonoscopy for colon cancer screening. - Declined flu vaccine, COVID booster, pneumonia vaccine, and shingles vaccine - Order Cologuard kit for colon cancer screening

## 2023-06-09 NOTE — Assessment & Plan Note (Signed)
 Followed with vascular surgery. On last check 11/16/2021, Duplex ultrasound of the carotid and vertebral arteries showed: - RICA<40% and LICA 40-59%  - antegrade flow in the left vertebral artery  - known occlusion of the right vertebral artery re-demonstrated.  Refer back to vascular for continued monitored and recommendations.

## 2023-06-09 NOTE — Assessment & Plan Note (Addendum)
 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 Last vascular follow-up was in 2023, and a follow-up was recommended but not completed. - Send referral to vascular specialist for follow-up, due to requirement of new insurance.

## 2023-06-09 NOTE — Assessment & Plan Note (Signed)
 Refill atorvastatin. Will check blood work on next visit.

## 2023-06-09 NOTE — Assessment & Plan Note (Signed)
 Asthma with seasonal exacerbations, particularly during pollen season, causing wheezing. An inhaler is prescribed for symptomatic relief, and Zyrtec is recommended for allergy management. - Prescribe inhaler for wheezing, to be used every 6 hours as needed - Recommend Zyrtec for allergy management

## 2023-06-09 NOTE — Assessment & Plan Note (Signed)
 Osteoarthritis with significant hip pain, previously managed with NSAIDs. Interested in exploring further treatment options. - Send referral to orthopedics for further evaluation and management - Discuss alternative pain management options such as capsaicin cream or lidocaine cream

## 2023-06-09 NOTE — Assessment & Plan Note (Signed)
 Uncontrolled hypertension likely due to a lapse in medication adherence for the past 4-5 months. Blood pressure was elevated during the visit. - Restart atorvastatin and amlodipine - Follow up in 4-6 weeks to assess blood pressure control - Bring blood pressure log and cuff to next appointment

## 2023-06-09 NOTE — Assessment & Plan Note (Addendum)
 Intermittent cocaine use, approximately once every one to two weeks. Aware of the risks, including the potential for cardiovascular events, and has expressed a desire to stop. - Encourage cessation of cocaine use due to risk of cardiovascular events - Offer referral to Child psychotherapist for support if interested. Patient declined for the time being.

## 2023-06-09 NOTE — Assessment & Plan Note (Signed)
 Noted. Will check blood work at follow up. - Avoid nephrotoxic medications, including NSAIDs, where possible - Consider alternative pain management options such as capsaicin cream or lidocaine cream

## 2023-07-20 ENCOUNTER — Encounter (INDEPENDENT_AMBULATORY_CARE_PROVIDER_SITE_OTHER): Payer: Self-pay | Admitting: Nurse Practitioner

## 2023-12-02 ENCOUNTER — Encounter: Admitting: Family Medicine

## 2024-01-23 ENCOUNTER — Emergency Department: Payer: Self-pay

## 2024-01-23 ENCOUNTER — Other Ambulatory Visit: Payer: Self-pay

## 2024-01-23 ENCOUNTER — Emergency Department
Admission: EM | Admit: 2024-01-23 | Discharge: 2024-01-23 | Disposition: A | Discharge status: Home / Self Care | Payer: Self-pay | Attending: Emergency Medicine | Admitting: Emergency Medicine

## 2024-01-23 ENCOUNTER — Encounter: Payer: Self-pay | Admitting: Emergency Medicine

## 2024-01-23 DIAGNOSIS — Y92410 Unspecified street and highway as the place of occurrence of the external cause: Secondary | ICD-10-CM | POA: Diagnosis not present

## 2024-01-23 DIAGNOSIS — M542 Cervicalgia: Secondary | ICD-10-CM | POA: Diagnosis not present

## 2024-01-23 DIAGNOSIS — I10 Essential (primary) hypertension: Secondary | ICD-10-CM

## 2024-01-23 DIAGNOSIS — R519 Headache, unspecified: Secondary | ICD-10-CM | POA: Diagnosis present

## 2024-01-23 DIAGNOSIS — R0789 Other chest pain: Secondary | ICD-10-CM | POA: Insufficient documentation

## 2024-01-23 DIAGNOSIS — N189 Chronic kidney disease, unspecified: Secondary | ICD-10-CM | POA: Insufficient documentation

## 2024-01-23 DIAGNOSIS — I129 Hypertensive chronic kidney disease with stage 1 through stage 4 chronic kidney disease, or unspecified chronic kidney disease: Secondary | ICD-10-CM | POA: Diagnosis not present

## 2024-01-23 DIAGNOSIS — K148 Other diseases of tongue: Secondary | ICD-10-CM | POA: Diagnosis not present

## 2024-01-23 LAB — COMPREHENSIVE METABOLIC PANEL WITH GFR
ALT: 25 U/L (ref 0–44)
AST: 68 U/L — ABNORMAL HIGH (ref 15–41)
Albumin: 3.8 g/dL (ref 3.5–5.0)
Alkaline Phosphatase: 44 U/L (ref 38–126)
Anion gap: 11 (ref 5–15)
BUN: 17 mg/dL (ref 8–23)
CO2: 23 mmol/L (ref 22–32)
Calcium: 8.3 mg/dL — ABNORMAL LOW (ref 8.9–10.3)
Chloride: 101 mmol/L (ref 98–111)
Creatinine, Ser: 1.22 mg/dL (ref 0.61–1.24)
GFR, Estimated: >60 mL/min (ref >60)
Glucose, Bld: 77 mg/dL (ref 70–99)
Potassium: 3.8 mmol/L (ref 3.5–5.1)
Sodium: 135 mmol/L (ref 135–145)
Total Bilirubin: 1.4 mg/dL — ABNORMAL HIGH (ref 0.0–1.2)
Total Protein: 7.8 g/dL (ref 6.5–8.1)

## 2024-01-23 LAB — CBC WITH DIFFERENTIAL/PLATELET
Abs Immature Granulocytes: 0.07 K/uL (ref 0.00–0.07)
Basophils Absolute: 0 K/uL (ref 0.0–0.1)
Basophils Relative: 0 %
Eosinophils Absolute: 0.1 K/uL (ref 0.0–0.5)
Eosinophils Relative: 1 %
HCT: 37 % — ABNORMAL LOW (ref 39.0–52.0)
Hemoglobin: 12.4 g/dL — ABNORMAL LOW (ref 13.0–17.0)
Immature Granulocytes: 1 %
Lymphocytes Relative: 10 %
Lymphs Abs: 1.1 K/uL (ref 0.7–4.0)
MCH: 31.7 pg (ref 26.0–34.0)
MCHC: 33.5 g/dL (ref 30.0–36.0)
MCV: 94.6 fL (ref 80.0–100.0)
Monocytes Absolute: 1 K/uL (ref 0.1–1.0)
Monocytes Relative: 9 %
Neutro Abs: 8.7 K/uL — ABNORMAL HIGH (ref 1.7–7.7)
Neutrophils Relative %: 79 %
Platelets: 200 K/uL (ref 150–400)
RBC: 3.91 MIL/uL — ABNORMAL LOW (ref 4.22–5.81)
RDW: 13.6 % (ref 11.5–15.5)
WBC: 11 K/uL — ABNORMAL HIGH (ref 4.0–10.5)
nRBC: 0 % (ref 0.0–0.2)

## 2024-01-23 LAB — TROPONIN I (HIGH SENSITIVITY): Troponin I (High Sensitivity): 14 ng/L (ref <18)

## 2024-01-23 MED ORDER — LOSARTAN POTASSIUM 50 MG PO TABS
50.0000 mg | ORAL_TABLET | Freq: Every day | ORAL | 2 refills | Status: AC
  Filled 2024-01-23: qty 30, 30d supply, fill #0

## 2024-01-23 MED ORDER — MORPHINE SULFATE (PF) 4 MG/ML IV SOLN
4.0000 mg | Freq: Once | INTRAVENOUS | Status: AC
  Administered 2024-01-23: 4 mg via INTRAVENOUS
  Filled 2024-01-23: qty 1

## 2024-01-23 MED ORDER — IOHEXOL 300 MG/ML  SOLN
100.0000 mL | Freq: Once | INTRAMUSCULAR | Status: AC | PRN
  Administered 2024-01-23: 100 mL via INTRAVENOUS

## 2024-01-23 MED ORDER — AMLODIPINE BESYLATE 10 MG PO TABS
10.0000 mg | ORAL_TABLET | Freq: Every day | ORAL | 2 refills | Status: AC
  Filled 2024-01-23: qty 30, 30d supply, fill #0
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
## 2024-01-24 ENCOUNTER — Other Ambulatory Visit: Payer: Self-pay

## 2024-01-27 ENCOUNTER — Other Ambulatory Visit: Payer: Self-pay

## 2024-02-03 ENCOUNTER — Other Ambulatory Visit: Payer: Self-pay
